# Patient Record
Sex: Male | Born: 1985 | Race: White | Hispanic: No | Marital: Single | Smoking: Never smoker
Health system: Southern US, Community
[De-identification: ages and names within clinical notes are randomized; demographics above are authoritative.]

## PROBLEM LIST (undated history)

## (undated) DIAGNOSIS — F840 Autistic disorder: Secondary | ICD-10-CM

## (undated) HISTORY — DX: Autistic disorder: F84.0

---
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
## 2016-05-01 DIAGNOSIS — F609 Personality disorder, unspecified: Secondary | ICD-10-CM | POA: Diagnosis not present

## 2016-05-14 DIAGNOSIS — F609 Personality disorder, unspecified: Secondary | ICD-10-CM | POA: Diagnosis not present

## 2016-05-22 DIAGNOSIS — F609 Personality disorder, unspecified: Secondary | ICD-10-CM | POA: Diagnosis not present

## 2016-05-29 DIAGNOSIS — F609 Personality disorder, unspecified: Secondary | ICD-10-CM | POA: Diagnosis not present

## 2016-05-31 DIAGNOSIS — K317 Polyp of stomach and duodenum: Secondary | ICD-10-CM | POA: Diagnosis not present

## 2016-05-31 DIAGNOSIS — K2950 Unspecified chronic gastritis without bleeding: Secondary | ICD-10-CM | POA: Diagnosis not present

## 2016-05-31 DIAGNOSIS — K2270 Barrett's esophagus without dysplasia: Secondary | ICD-10-CM | POA: Diagnosis not present

## 2016-06-04 DIAGNOSIS — F639 Impulse disorder, unspecified: Secondary | ICD-10-CM | POA: Diagnosis not present

## 2016-07-06 DIAGNOSIS — F840 Autistic disorder: Secondary | ICD-10-CM | POA: Diagnosis not present

## 2016-07-06 DIAGNOSIS — F429 Obsessive-compulsive disorder, unspecified: Secondary | ICD-10-CM | POA: Diagnosis not present

## 2016-07-06 DIAGNOSIS — F849 Pervasive developmental disorder, unspecified: Secondary | ICD-10-CM | POA: Diagnosis not present

## 2016-07-06 DIAGNOSIS — K22719 Barrett's esophagus with dysplasia, unspecified: Secondary | ICD-10-CM | POA: Diagnosis not present

## 2016-07-06 DIAGNOSIS — Z6829 Body mass index (BMI) 29.0-29.9, adult: Secondary | ICD-10-CM | POA: Diagnosis not present

## 2016-07-09 DIAGNOSIS — F913 Oppositional defiant disorder: Secondary | ICD-10-CM | POA: Diagnosis not present

## 2016-07-09 DIAGNOSIS — F840 Autistic disorder: Secondary | ICD-10-CM | POA: Diagnosis not present

## 2016-07-09 DIAGNOSIS — F4010 Social phobia, unspecified: Secondary | ICD-10-CM | POA: Diagnosis not present

## 2016-07-26 DIAGNOSIS — F4010 Social phobia, unspecified: Secondary | ICD-10-CM | POA: Diagnosis not present

## 2016-07-26 DIAGNOSIS — F913 Oppositional defiant disorder: Secondary | ICD-10-CM | POA: Diagnosis not present

## 2016-07-26 DIAGNOSIS — F840 Autistic disorder: Secondary | ICD-10-CM | POA: Diagnosis not present

## 2016-08-02 DIAGNOSIS — F913 Oppositional defiant disorder: Secondary | ICD-10-CM | POA: Diagnosis not present

## 2016-08-02 DIAGNOSIS — F4010 Social phobia, unspecified: Secondary | ICD-10-CM | POA: Diagnosis not present

## 2016-08-02 DIAGNOSIS — F840 Autistic disorder: Secondary | ICD-10-CM | POA: Diagnosis not present

## 2016-08-11 DIAGNOSIS — F4010 Social phobia, unspecified: Secondary | ICD-10-CM | POA: Diagnosis not present

## 2016-08-11 DIAGNOSIS — F840 Autistic disorder: Secondary | ICD-10-CM | POA: Diagnosis not present

## 2016-08-11 DIAGNOSIS — F913 Oppositional defiant disorder: Secondary | ICD-10-CM | POA: Diagnosis not present

## 2016-08-16 DIAGNOSIS — F840 Autistic disorder: Secondary | ICD-10-CM | POA: Diagnosis not present

## 2016-08-16 DIAGNOSIS — F4010 Social phobia, unspecified: Secondary | ICD-10-CM | POA: Diagnosis not present

## 2016-08-16 DIAGNOSIS — F913 Oppositional defiant disorder: Secondary | ICD-10-CM | POA: Diagnosis not present

## 2016-08-18 DIAGNOSIS — F840 Autistic disorder: Secondary | ICD-10-CM | POA: Diagnosis not present

## 2016-08-18 DIAGNOSIS — F4010 Social phobia, unspecified: Secondary | ICD-10-CM | POA: Diagnosis not present

## 2016-08-18 DIAGNOSIS — F913 Oppositional defiant disorder: Secondary | ICD-10-CM | POA: Diagnosis not present

## 2016-08-23 DIAGNOSIS — F4010 Social phobia, unspecified: Secondary | ICD-10-CM | POA: Diagnosis not present

## 2016-08-23 DIAGNOSIS — F840 Autistic disorder: Secondary | ICD-10-CM | POA: Diagnosis not present

## 2016-08-23 DIAGNOSIS — F913 Oppositional defiant disorder: Secondary | ICD-10-CM | POA: Diagnosis not present

## 2016-08-30 DIAGNOSIS — F913 Oppositional defiant disorder: Secondary | ICD-10-CM | POA: Diagnosis not present

## 2016-08-30 DIAGNOSIS — F4010 Social phobia, unspecified: Secondary | ICD-10-CM | POA: Diagnosis not present

## 2016-08-30 DIAGNOSIS — F840 Autistic disorder: Secondary | ICD-10-CM | POA: Diagnosis not present

## 2016-09-12 DIAGNOSIS — F4010 Social phobia, unspecified: Secondary | ICD-10-CM | POA: Diagnosis not present

## 2016-09-12 DIAGNOSIS — F913 Oppositional defiant disorder: Secondary | ICD-10-CM | POA: Diagnosis not present

## 2016-09-22 DIAGNOSIS — F4010 Social phobia, unspecified: Secondary | ICD-10-CM | POA: Diagnosis not present

## 2016-09-22 DIAGNOSIS — F913 Oppositional defiant disorder: Secondary | ICD-10-CM | POA: Diagnosis not present

## 2016-09-25 DIAGNOSIS — F840 Autistic disorder: Secondary | ICD-10-CM | POA: Diagnosis not present

## 2016-09-25 DIAGNOSIS — Z136 Encounter for screening for cardiovascular disorders: Secondary | ICD-10-CM | POA: Diagnosis not present

## 2016-09-25 DIAGNOSIS — K22719 Barrett's esophagus with dysplasia, unspecified: Secondary | ICD-10-CM | POA: Diagnosis not present

## 2016-10-01 DIAGNOSIS — F913 Oppositional defiant disorder: Secondary | ICD-10-CM | POA: Diagnosis not present

## 2016-10-01 DIAGNOSIS — F4010 Social phobia, unspecified: Secondary | ICD-10-CM | POA: Diagnosis not present

## 2016-10-02 DIAGNOSIS — Z0000 Encounter for general adult medical examination without abnormal findings: Secondary | ICD-10-CM | POA: Diagnosis not present

## 2016-10-02 DIAGNOSIS — F429 Obsessive-compulsive disorder, unspecified: Secondary | ICD-10-CM | POA: Diagnosis not present

## 2016-10-02 DIAGNOSIS — F840 Autistic disorder: Secondary | ICD-10-CM | POA: Diagnosis not present

## 2016-10-29 DIAGNOSIS — F913 Oppositional defiant disorder: Secondary | ICD-10-CM | POA: Diagnosis not present

## 2016-10-29 DIAGNOSIS — F4010 Social phobia, unspecified: Secondary | ICD-10-CM | POA: Diagnosis not present

## 2016-11-04 DIAGNOSIS — F913 Oppositional defiant disorder: Secondary | ICD-10-CM | POA: Diagnosis not present

## 2016-11-04 DIAGNOSIS — F4010 Social phobia, unspecified: Secondary | ICD-10-CM | POA: Diagnosis not present

## 2016-11-15 DIAGNOSIS — F913 Oppositional defiant disorder: Secondary | ICD-10-CM | POA: Diagnosis not present

## 2016-11-15 DIAGNOSIS — F4010 Social phobia, unspecified: Secondary | ICD-10-CM | POA: Diagnosis not present

## 2016-11-29 DIAGNOSIS — F913 Oppositional defiant disorder: Secondary | ICD-10-CM | POA: Diagnosis not present

## 2016-11-29 DIAGNOSIS — F4010 Social phobia, unspecified: Secondary | ICD-10-CM | POA: Diagnosis not present

## 2016-12-06 DIAGNOSIS — F4010 Social phobia, unspecified: Secondary | ICD-10-CM | POA: Diagnosis not present

## 2016-12-06 DIAGNOSIS — F913 Oppositional defiant disorder: Secondary | ICD-10-CM | POA: Diagnosis not present

## 2016-12-13 DIAGNOSIS — F913 Oppositional defiant disorder: Secondary | ICD-10-CM | POA: Diagnosis not present

## 2016-12-13 DIAGNOSIS — F4010 Social phobia, unspecified: Secondary | ICD-10-CM | POA: Diagnosis not present

## 2016-12-27 DIAGNOSIS — F913 Oppositional defiant disorder: Secondary | ICD-10-CM | POA: Diagnosis not present

## 2016-12-27 DIAGNOSIS — F4010 Social phobia, unspecified: Secondary | ICD-10-CM | POA: Diagnosis not present

## 2016-12-31 DIAGNOSIS — F4010 Social phobia, unspecified: Secondary | ICD-10-CM | POA: Diagnosis not present

## 2016-12-31 DIAGNOSIS — F913 Oppositional defiant disorder: Secondary | ICD-10-CM | POA: Diagnosis not present

## 2017-01-03 DIAGNOSIS — F913 Oppositional defiant disorder: Secondary | ICD-10-CM | POA: Diagnosis not present

## 2017-01-03 DIAGNOSIS — F4010 Social phobia, unspecified: Secondary | ICD-10-CM | POA: Diagnosis not present

## 2017-01-16 DIAGNOSIS — F4010 Social phobia, unspecified: Secondary | ICD-10-CM | POA: Diagnosis not present

## 2017-01-16 DIAGNOSIS — F913 Oppositional defiant disorder: Secondary | ICD-10-CM | POA: Diagnosis not present

## 2017-02-18 DIAGNOSIS — K22719 Barrett's esophagus with dysplasia, unspecified: Secondary | ICD-10-CM | POA: Diagnosis not present

## 2017-02-18 DIAGNOSIS — K210 Gastro-esophageal reflux disease with esophagitis: Secondary | ICD-10-CM | POA: Diagnosis not present

## 2017-02-28 DIAGNOSIS — Z23 Encounter for immunization: Secondary | ICD-10-CM | POA: Diagnosis not present

## 2017-03-13 DIAGNOSIS — F4010 Social phobia, unspecified: Secondary | ICD-10-CM | POA: Diagnosis not present

## 2017-03-13 DIAGNOSIS — F913 Oppositional defiant disorder: Secondary | ICD-10-CM | POA: Diagnosis not present

## 2017-03-20 DIAGNOSIS — F4010 Social phobia, unspecified: Secondary | ICD-10-CM | POA: Diagnosis not present

## 2017-03-20 DIAGNOSIS — F913 Oppositional defiant disorder: Secondary | ICD-10-CM | POA: Diagnosis not present

## 2017-03-21 DIAGNOSIS — F913 Oppositional defiant disorder: Secondary | ICD-10-CM | POA: Diagnosis not present

## 2017-03-21 DIAGNOSIS — F4010 Social phobia, unspecified: Secondary | ICD-10-CM | POA: Diagnosis not present

## 2017-03-27 DIAGNOSIS — F840 Autistic disorder: Secondary | ICD-10-CM | POA: Diagnosis not present

## 2017-03-27 DIAGNOSIS — K22719 Barrett's esophagus with dysplasia, unspecified: Secondary | ICD-10-CM | POA: Diagnosis not present

## 2017-03-27 DIAGNOSIS — Z1211 Encounter for screening for malignant neoplasm of colon: Secondary | ICD-10-CM | POA: Diagnosis not present

## 2017-04-03 DIAGNOSIS — F913 Oppositional defiant disorder: Secondary | ICD-10-CM | POA: Diagnosis not present

## 2017-04-03 DIAGNOSIS — F4010 Social phobia, unspecified: Secondary | ICD-10-CM | POA: Diagnosis not present

## 2017-04-10 DIAGNOSIS — F913 Oppositional defiant disorder: Secondary | ICD-10-CM | POA: Diagnosis not present

## 2017-04-10 DIAGNOSIS — F4010 Social phobia, unspecified: Secondary | ICD-10-CM | POA: Diagnosis not present

## 2017-05-07 DIAGNOSIS — F913 Oppositional defiant disorder: Secondary | ICD-10-CM | POA: Diagnosis not present

## 2017-05-07 DIAGNOSIS — F4010 Social phobia, unspecified: Secondary | ICD-10-CM | POA: Diagnosis not present

## 2017-05-29 DIAGNOSIS — F4010 Social phobia, unspecified: Secondary | ICD-10-CM | POA: Diagnosis not present

## 2017-05-29 DIAGNOSIS — F913 Oppositional defiant disorder: Secondary | ICD-10-CM | POA: Diagnosis not present

## 2017-06-06 DIAGNOSIS — F913 Oppositional defiant disorder: Secondary | ICD-10-CM | POA: Diagnosis not present

## 2017-06-06 DIAGNOSIS — F4010 Social phobia, unspecified: Secondary | ICD-10-CM | POA: Diagnosis not present

## 2017-06-13 DIAGNOSIS — F913 Oppositional defiant disorder: Secondary | ICD-10-CM | POA: Diagnosis not present

## 2017-06-13 DIAGNOSIS — F4010 Social phobia, unspecified: Secondary | ICD-10-CM | POA: Diagnosis not present

## 2017-06-19 DIAGNOSIS — F4010 Social phobia, unspecified: Secondary | ICD-10-CM | POA: Diagnosis not present

## 2017-06-19 DIAGNOSIS — F913 Oppositional defiant disorder: Secondary | ICD-10-CM | POA: Diagnosis not present

## 2017-06-26 DIAGNOSIS — F913 Oppositional defiant disorder: Secondary | ICD-10-CM | POA: Diagnosis not present

## 2017-06-26 DIAGNOSIS — F4010 Social phobia, unspecified: Secondary | ICD-10-CM | POA: Diagnosis not present

## 2017-07-02 DIAGNOSIS — F913 Oppositional defiant disorder: Secondary | ICD-10-CM | POA: Diagnosis not present

## 2017-07-02 DIAGNOSIS — F4010 Social phobia, unspecified: Secondary | ICD-10-CM | POA: Diagnosis not present

## 2017-07-03 DIAGNOSIS — F429 Obsessive-compulsive disorder, unspecified: Secondary | ICD-10-CM | POA: Diagnosis not present

## 2017-07-03 DIAGNOSIS — Z6829 Body mass index (BMI) 29.0-29.9, adult: Secondary | ICD-10-CM | POA: Diagnosis not present

## 2017-07-03 DIAGNOSIS — F840 Autistic disorder: Secondary | ICD-10-CM | POA: Diagnosis not present

## 2017-07-03 DIAGNOSIS — K22719 Barrett's esophagus with dysplasia, unspecified: Secondary | ICD-10-CM | POA: Diagnosis not present

## 2017-07-03 DIAGNOSIS — F849 Pervasive developmental disorder, unspecified: Secondary | ICD-10-CM | POA: Diagnosis not present

## 2017-07-10 DIAGNOSIS — F488 Other specified nonpsychotic mental disorders: Secondary | ICD-10-CM | POA: Diagnosis not present

## 2017-07-17 DIAGNOSIS — F488 Other specified nonpsychotic mental disorders: Secondary | ICD-10-CM | POA: Diagnosis not present

## 2017-07-22 DIAGNOSIS — F4010 Social phobia, unspecified: Secondary | ICD-10-CM | POA: Diagnosis not present

## 2017-07-22 DIAGNOSIS — F913 Oppositional defiant disorder: Secondary | ICD-10-CM | POA: Diagnosis not present

## 2017-07-24 DIAGNOSIS — F488 Other specified nonpsychotic mental disorders: Secondary | ICD-10-CM | POA: Diagnosis not present

## 2017-07-31 DIAGNOSIS — F488 Other specified nonpsychotic mental disorders: Secondary | ICD-10-CM | POA: Diagnosis not present

## 2017-08-14 DIAGNOSIS — F488 Other specified nonpsychotic mental disorders: Secondary | ICD-10-CM | POA: Diagnosis not present

## 2017-11-12 DIAGNOSIS — M545 Low back pain: Secondary | ICD-10-CM | POA: Diagnosis not present

## 2017-11-12 DIAGNOSIS — Z23 Encounter for immunization: Secondary | ICD-10-CM | POA: Diagnosis not present

## 2017-11-12 DIAGNOSIS — R748 Abnormal levels of other serum enzymes: Secondary | ICD-10-CM | POA: Diagnosis not present

## 2017-11-12 DIAGNOSIS — K2270 Barrett's esophagus without dysplasia: Secondary | ICD-10-CM | POA: Diagnosis not present

## 2017-11-15 ENCOUNTER — Other Ambulatory Visit: Payer: Self-pay | Admitting: Family Medicine

## 2017-11-15 DIAGNOSIS — R748 Abnormal levels of other serum enzymes: Secondary | ICD-10-CM

## 2017-11-20 ENCOUNTER — Other Ambulatory Visit: Payer: Self-pay

## 2017-11-22 ENCOUNTER — Ambulatory Visit
Admission: RE | Admit: 2017-11-22 | Discharge: 2017-11-22 | Disposition: A | Discharge status: Home / Self Care | Payer: Medicare Other | Source: Ambulatory Visit | Attending: Family Medicine | Admitting: Family Medicine

## 2017-11-22 DIAGNOSIS — R7989 Other specified abnormal findings of blood chemistry: Secondary | ICD-10-CM | POA: Diagnosis not present

## 2017-11-22 DIAGNOSIS — R748 Abnormal levels of other serum enzymes: Secondary | ICD-10-CM

## 2017-12-17 DIAGNOSIS — R159 Full incontinence of feces: Secondary | ICD-10-CM | POA: Diagnosis not present

## 2017-12-17 DIAGNOSIS — R197 Diarrhea, unspecified: Secondary | ICD-10-CM | POA: Diagnosis not present

## 2017-12-17 DIAGNOSIS — R748 Abnormal levels of other serum enzymes: Secondary | ICD-10-CM | POA: Diagnosis not present

## 2017-12-26 DIAGNOSIS — Z79891 Long term (current) use of opiate analgesic: Secondary | ICD-10-CM | POA: Diagnosis not present

## 2018-06-19 DIAGNOSIS — F902 Attention-deficit hyperactivity disorder, combined type: Secondary | ICD-10-CM | POA: Diagnosis not present

## 2018-06-19 DIAGNOSIS — F0634 Mood disorder due to known physiological condition with mixed features: Secondary | ICD-10-CM | POA: Diagnosis not present

## 2018-06-19 DIAGNOSIS — F840 Autistic disorder: Secondary | ICD-10-CM | POA: Diagnosis not present

## 2018-06-19 DIAGNOSIS — F419 Anxiety disorder, unspecified: Secondary | ICD-10-CM | POA: Diagnosis not present

## 2018-07-01 DIAGNOSIS — F609 Personality disorder, unspecified: Secondary | ICD-10-CM | POA: Diagnosis not present

## 2018-07-01 DIAGNOSIS — F840 Autistic disorder: Secondary | ICD-10-CM | POA: Diagnosis not present

## 2018-08-18 DIAGNOSIS — F639 Impulse disorder, unspecified: Secondary | ICD-10-CM | POA: Diagnosis not present

## 2018-08-18 DIAGNOSIS — F840 Autistic disorder: Secondary | ICD-10-CM | POA: Diagnosis not present

## 2018-08-18 DIAGNOSIS — F609 Personality disorder, unspecified: Secondary | ICD-10-CM | POA: Diagnosis not present

## 2018-08-18 DIAGNOSIS — R159 Full incontinence of feces: Secondary | ICD-10-CM | POA: Diagnosis not present

## 2018-09-10 DIAGNOSIS — F639 Impulse disorder, unspecified: Secondary | ICD-10-CM | POA: Diagnosis not present

## 2018-09-10 DIAGNOSIS — F609 Personality disorder, unspecified: Secondary | ICD-10-CM | POA: Diagnosis not present

## 2018-09-10 DIAGNOSIS — F840 Autistic disorder: Secondary | ICD-10-CM | POA: Diagnosis not present

## 2018-09-16 DIAGNOSIS — F918 Other conduct disorders: Secondary | ICD-10-CM | POA: Diagnosis not present

## 2018-09-16 DIAGNOSIS — F913 Oppositional defiant disorder: Secondary | ICD-10-CM | POA: Diagnosis not present

## 2018-09-16 DIAGNOSIS — F840 Autistic disorder: Secondary | ICD-10-CM | POA: Diagnosis not present

## 2018-09-18 DIAGNOSIS — F840 Autistic disorder: Secondary | ICD-10-CM | POA: Diagnosis not present

## 2018-09-25 DIAGNOSIS — F639 Impulse disorder, unspecified: Secondary | ICD-10-CM | POA: Diagnosis not present

## 2018-09-25 DIAGNOSIS — F609 Personality disorder, unspecified: Secondary | ICD-10-CM | POA: Diagnosis not present

## 2018-09-25 DIAGNOSIS — F913 Oppositional defiant disorder: Secondary | ICD-10-CM | POA: Diagnosis not present

## 2018-09-25 DIAGNOSIS — F840 Autistic disorder: Secondary | ICD-10-CM | POA: Diagnosis not present

## 2018-10-07 DIAGNOSIS — F913 Oppositional defiant disorder: Secondary | ICD-10-CM | POA: Diagnosis not present

## 2018-10-07 DIAGNOSIS — F918 Other conduct disorders: Secondary | ICD-10-CM | POA: Diagnosis not present

## 2018-10-07 DIAGNOSIS — F840 Autistic disorder: Secondary | ICD-10-CM | POA: Diagnosis not present

## 2018-10-09 DIAGNOSIS — F913 Oppositional defiant disorder: Secondary | ICD-10-CM | POA: Diagnosis not present

## 2018-10-09 DIAGNOSIS — F840 Autistic disorder: Secondary | ICD-10-CM | POA: Diagnosis not present

## 2018-12-20 IMAGING — US US ABDOMEN LIMITED
1 series · 14 of 25 positions shown · non-contrast
Comparison: None.

CLINICAL DATA: 31-year-old male with a history of elevated LFTs

EXAM:
ULTRASOUND ABDOMEN LIMITED RIGHT UPPER QUADRANT

[Series 1: us abdomen limited · 0.30mm/px · 14 of 39 slices shown]
[im 1/39]
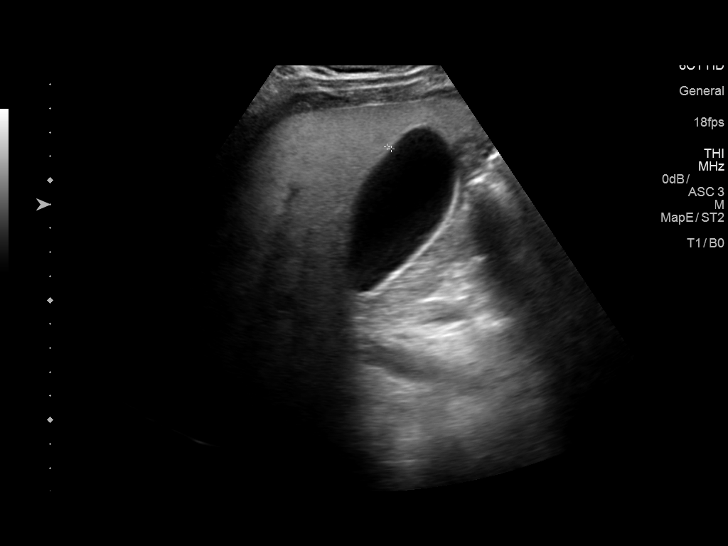
[im 4/39]
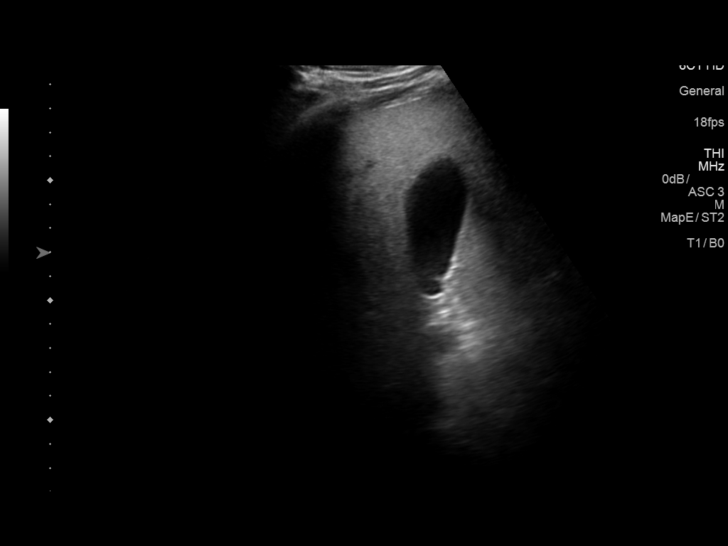
[im 7/39]
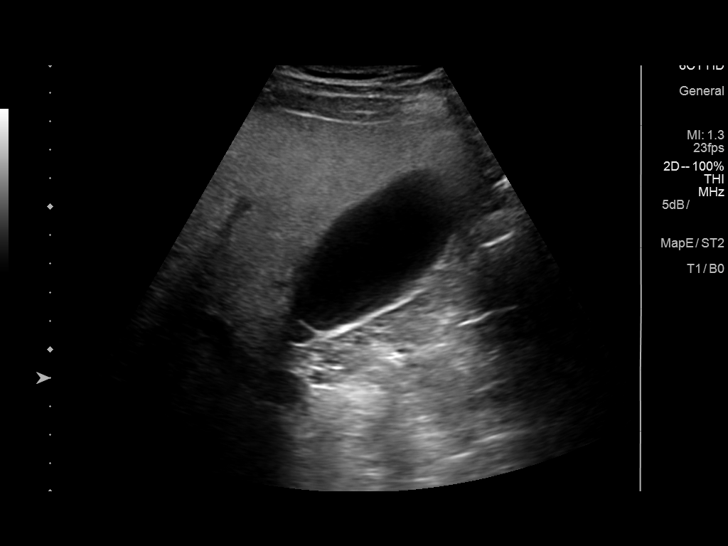
[im 10/39]
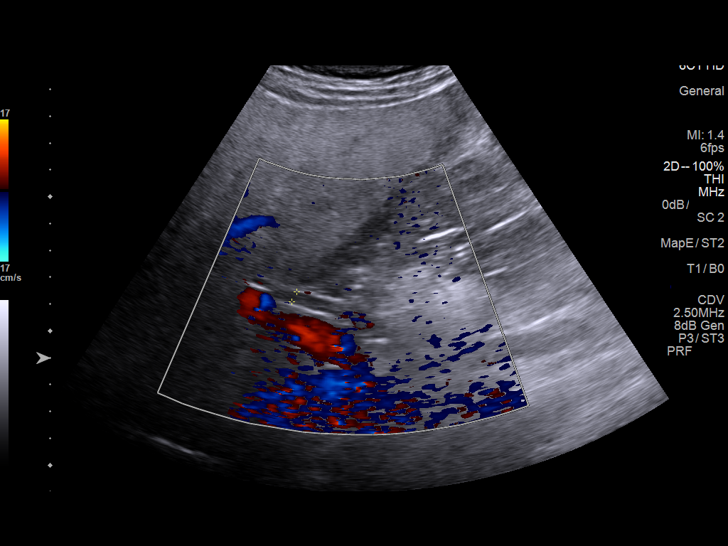
[im 13/39]
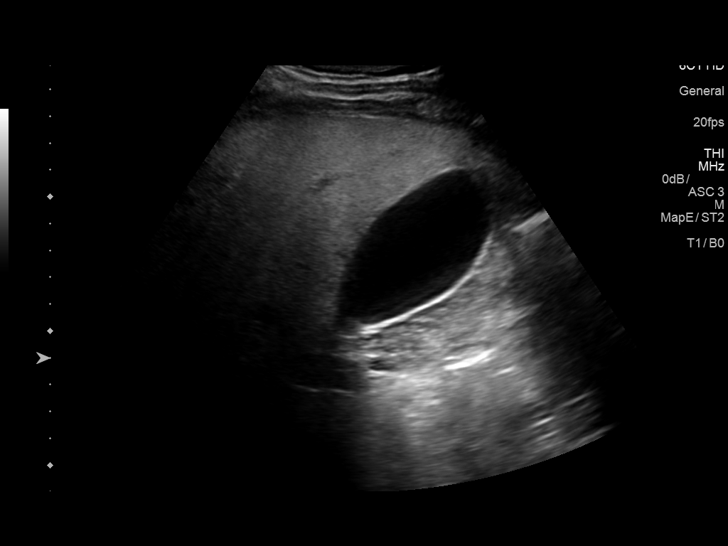
[im 15/39]
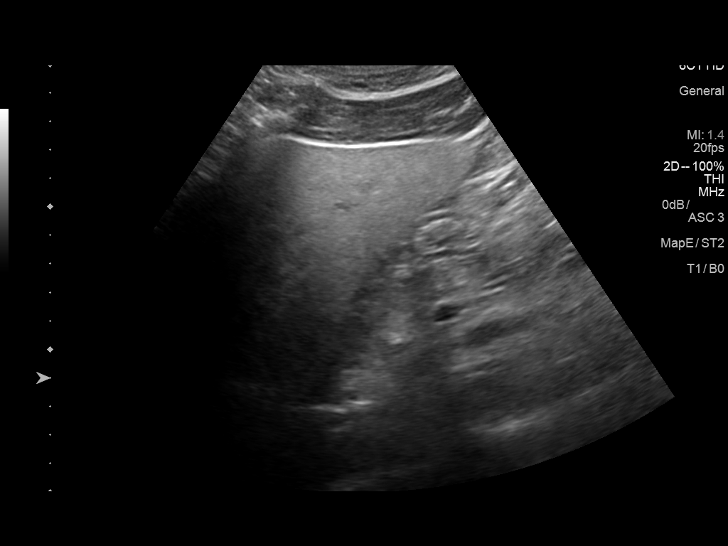
[im 18/39]
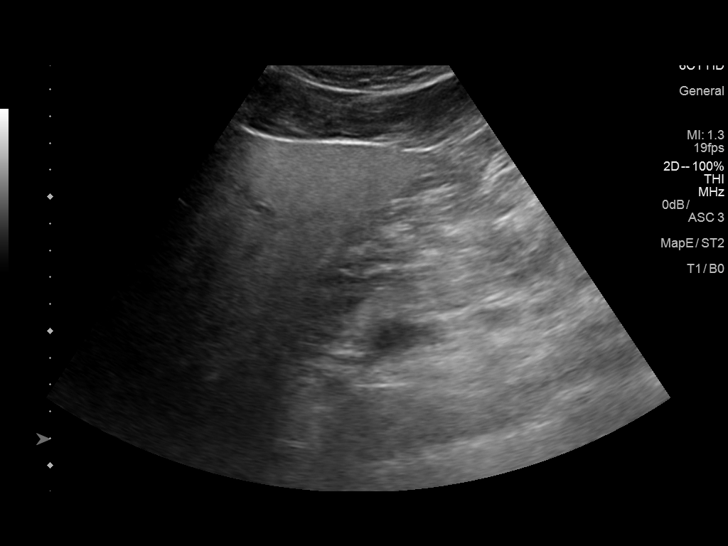
[im 21/39]
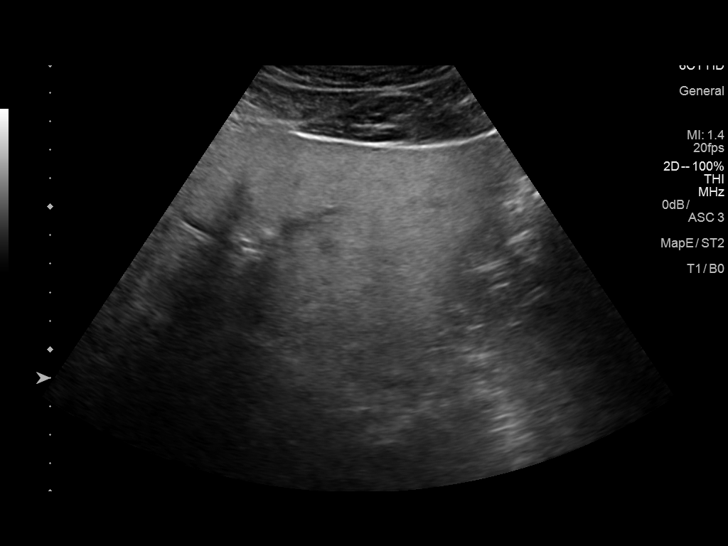
[im 24/39]
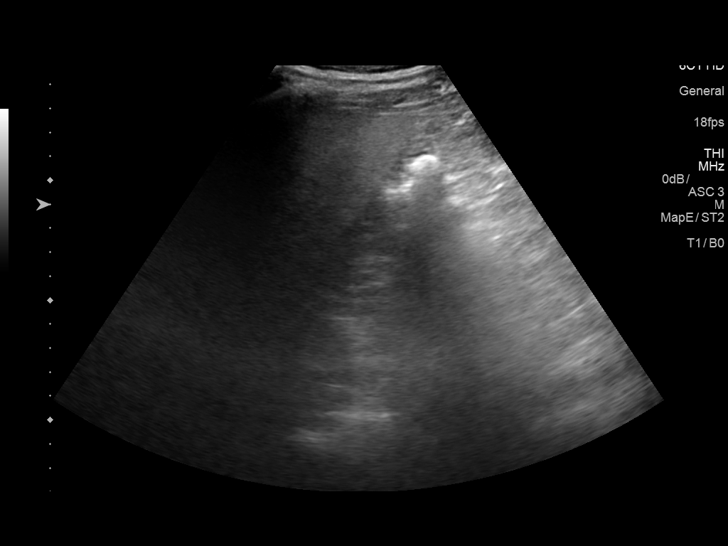
[im 26/39]
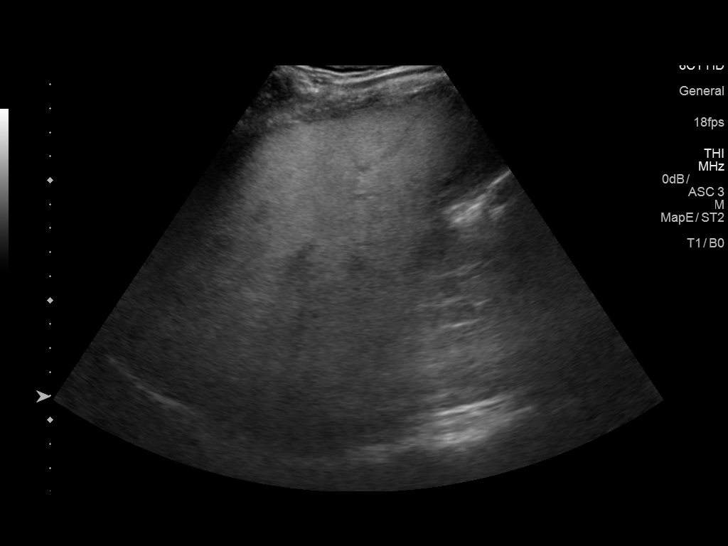
[im 29/39]
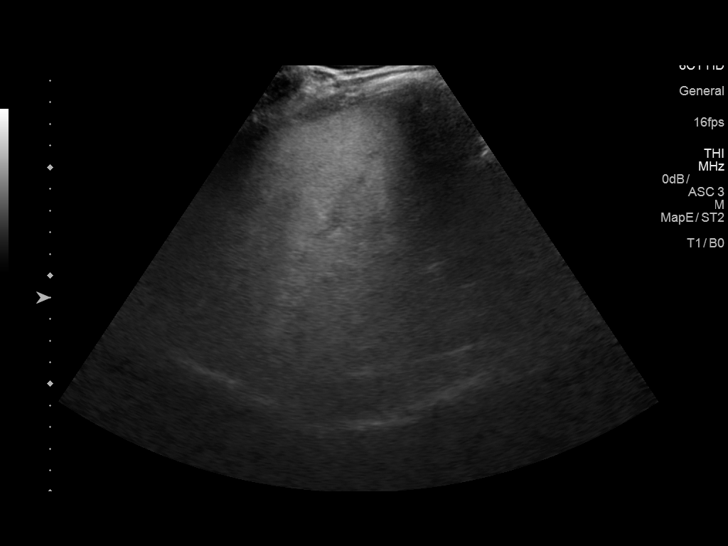
[im 32/39]
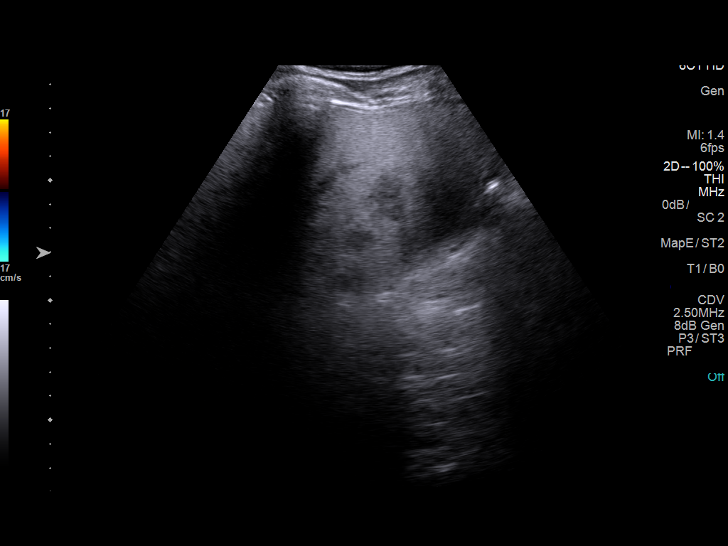
[im 35/39]
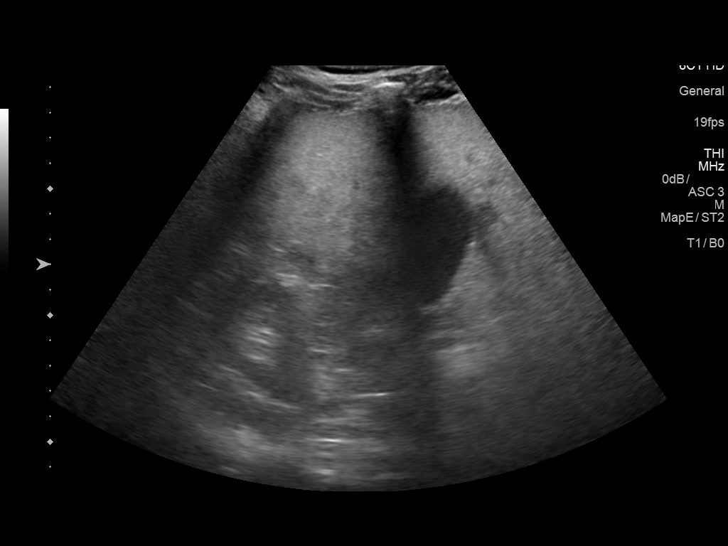
[im 39/39]
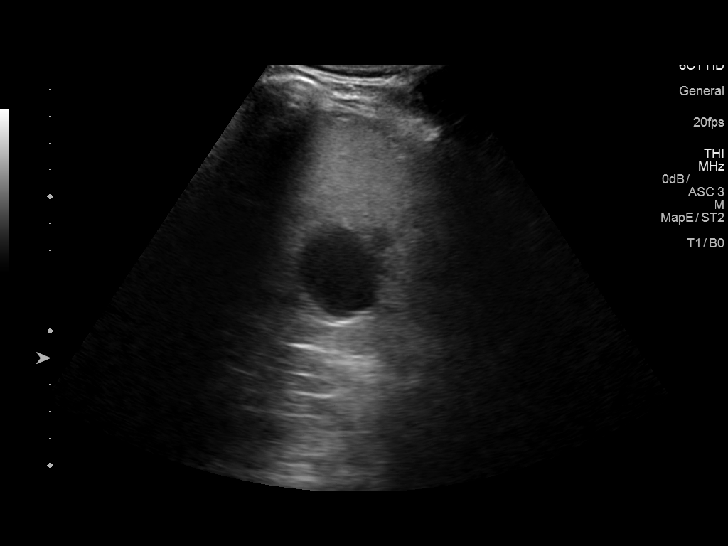

[14 of 25 positions shown; findings below may reference images not displayed]

FINDINGS: Gallbladder:

No gallstones or wall thickening visualized. No sonographic Murphy
sign noted by sonographer.

Common bile duct:

Diameter: 4 mm-5 mm

Liver:

Diffusely heterogeneous and increased echotexture of liver
parenchyma. Liver appears enlarged. Portal vein is patent on color
Doppler imaging with normal direction of blood flow towards the
liver.
IMPRESSION: Negative for cholelithiasis.

Heterogeneous appearance of liver suggesting nonspecific medical
liver disease.

## 2021-08-16 DIAGNOSIS — F418 Other specified anxiety disorders: Secondary | ICD-10-CM | POA: Insufficient documentation

## 2022-06-14 ENCOUNTER — Ambulatory Visit: Payer: Self-pay | Admitting: Psychology

## 2022-06-25 ENCOUNTER — Ambulatory Visit: Payer: Medicare Other | Admitting: Behavioral Health

## 2022-06-28 ENCOUNTER — Encounter: Payer: Self-pay | Admitting: Psychology

## 2022-06-28 NOTE — Patient Instructions (Signed)
Erroneous encounter

## 2022-06-28 NOTE — Progress Notes (Signed)
                Ivan Anchors, [facility] This encounter was created in error - please disregard.

## 2022-07-05 ENCOUNTER — Encounter: Payer: Self-pay | Admitting: Behavioral Health

## 2022-07-05 ENCOUNTER — Ambulatory Visit: Payer: Medicare Other | Admitting: Behavioral Health

## 2022-07-05 VITALS — BP 136/74 | HR 71 | Ht 68.0 in | Wt 184.0 lb

## 2022-07-05 DIAGNOSIS — F840 Autistic disorder: Secondary | ICD-10-CM

## 2022-07-05 DIAGNOSIS — F411 Generalized anxiety disorder: Secondary | ICD-10-CM

## 2022-07-05 DIAGNOSIS — F331 Major depressive disorder, recurrent, moderate: Secondary | ICD-10-CM
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
## 2022-07-26 ENCOUNTER — Encounter: Payer: Self-pay | Admitting: Behavioral Health

## 2022-07-26 ENCOUNTER — Ambulatory Visit (INDEPENDENT_AMBULATORY_CARE_PROVIDER_SITE_OTHER): Payer: Medicare Other | Admitting: Behavioral Health

## 2022-07-26 DIAGNOSIS — F331 Major depressive disorder, recurrent, moderate: Secondary | ICD-10-CM

## 2022-07-26 DIAGNOSIS — F840 Autistic disorder: Secondary | ICD-10-CM

## 2022-07-26 DIAGNOSIS — F411 Generalized anxiety disorder: Secondary | ICD-10-CM | POA: Diagnosis not present

## 2022-07-26 DIAGNOSIS — F70 Mild intellectual disabilities: Secondary | ICD-10-CM | POA: Diagnosis not present

## 2022-07-26 MED ORDER — HYDROXYZINE PAMOATE 25 MG PO CAPS: 25.0000 mg | ORAL_CAPSULE | Freq: Three times a day (TID) | ORAL | 3 refills | Status: DC

## 2022-07-26 MED ORDER — ARIPIPRAZOLE 30 MG PO TABS: 30.0000 mg | ORAL_TABLET | Freq: Every day | ORAL | 3 refills | Status: DC

## 2022-07-26 MED ORDER — FLUOXETINE HCL 40 MG PO CAPS: 80.0000 mg | ORAL_CAPSULE | Freq: Every day | ORAL | 3 refills | Status: DC

## 2022-07-26 MED ORDER — GUANFACINE HCL ER 2 MG PO TB24: 2.0000 mg | ORAL_TABLET | Freq: Every morning | ORAL | 3 refills | Status: DC
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
## 2022-07-30 DIAGNOSIS — R945 Abnormal results of liver function studies: Secondary | ICD-10-CM | POA: Diagnosis not present

## 2022-07-30 DIAGNOSIS — R748 Abnormal levels of other serum enzymes: Secondary | ICD-10-CM | POA: Diagnosis not present

## 2022-07-30 DIAGNOSIS — M79645 Pain in left finger(s): Secondary | ICD-10-CM | POA: Diagnosis not present

## 2022-07-30 DIAGNOSIS — K2270 Barrett's esophagus without dysplasia: Secondary | ICD-10-CM | POA: Diagnosis not present

## 2022-08-01 ENCOUNTER — Other Ambulatory Visit: Payer: Self-pay | Admitting: Family Medicine

## 2022-08-01 DIAGNOSIS — R748 Abnormal levels of other serum enzymes: Secondary | ICD-10-CM

## 2022-08-03 ENCOUNTER — Other Ambulatory Visit: Payer: Self-pay | Admitting: Behavioral Health

## 2022-08-03 DIAGNOSIS — F331 Major depressive disorder, recurrent, moderate: Secondary | ICD-10-CM

## 2022-08-03 DIAGNOSIS — F411 Generalized anxiety disorder: Secondary | ICD-10-CM

## 2022-08-03 DIAGNOSIS — F840 Autistic disorder: Secondary | ICD-10-CM

## 2022-08-07 ENCOUNTER — Telehealth: Payer: Self-pay | Admitting: Behavioral Health

## 2022-08-07 NOTE — Telephone Encounter (Signed)
Mom, Jocelyn, called at 2:25  She said you were going to call her with a recommendation on a Dr. for Caryn Bee to see that can treat him and prescribe medication.  She is still awaiting that call.  Please call her with you recommendation or referral.

## 2022-08-18 ENCOUNTER — Other Ambulatory Visit: Payer: Self-pay | Admitting: Behavioral Health

## 2022-08-18 DIAGNOSIS — F331 Major depressive disorder, recurrent, moderate: Secondary | ICD-10-CM

## 2022-08-18 DIAGNOSIS — F840 Autistic disorder: Secondary | ICD-10-CM

## 2022-08-18 DIAGNOSIS — F411 Generalized anxiety disorder: Secondary | ICD-10-CM

## 2022-08-18 DIAGNOSIS — F70 Mild intellectual disabilities: Secondary | ICD-10-CM

## 2022-08-23 DIAGNOSIS — K7689 Other specified diseases of liver: Secondary | ICD-10-CM | POA: Diagnosis not present

## 2022-08-23 DIAGNOSIS — R748 Abnormal levels of other serum enzymes: Secondary | ICD-10-CM | POA: Diagnosis not present

## 2022-08-24 ENCOUNTER — Other Ambulatory Visit: Payer: Self-pay | Admitting: Family Medicine

## 2022-08-24 DIAGNOSIS — K769 Liver disease, unspecified: Secondary | ICD-10-CM

## 2022-09-04 ENCOUNTER — Telehealth: Payer: Self-pay | Admitting: Behavioral Health

## 2022-09-04 NOTE — Telephone Encounter (Signed)
Mom, Elijah Gordon, called at 2:20 and indicated that she was still waiting for your call back about a referral to a provider in [place].  She also said that she had given a lot of records about [facility] care.  She would like it back.  Please advise if you have a referral for her and let me know if you still have the records so I can get them back to her.

## 2022-09-10 NOTE — Telephone Encounter (Signed)
Spoke with [place] today.  I place the information Arlys John found on Adult Autism in the packet of records.  Information provided is as follows:  [facility] [place] [phone]; can provide info on residential options; [facility] info for [place], [place] [phone]; [facility] [phone] for treatment of autism and Aspergers.  Jocelyn indicated she would pick up the packet today.

## 2022-09-24 ENCOUNTER — Encounter (HOSPITAL_COMMUNITY): Payer: Self-pay | Admitting: Registered Nurse

## 2022-09-24 ENCOUNTER — Ambulatory Visit (HOSPITAL_COMMUNITY)
Admission: EM | Admit: 2022-09-24 | Discharge: 2022-09-24 | Disposition: A | Discharge status: Home / Self Care | Payer: Medicaid Other | Attending: Registered Nurse | Admitting: Registered Nurse

## 2022-09-24 ENCOUNTER — Other Ambulatory Visit: Payer: Self-pay | Admitting: Behavioral Health

## 2022-09-24 DIAGNOSIS — F639 Impulse disorder, unspecified: Secondary | ICD-10-CM

## 2022-09-24 DIAGNOSIS — F429 Obsessive-compulsive disorder, unspecified: Secondary | ICD-10-CM

## 2022-09-24 DIAGNOSIS — F70 Mild intellectual disabilities: Secondary | ICD-10-CM

## 2022-09-24 DIAGNOSIS — Z638 Other specified problems related to primary support group: Secondary | ICD-10-CM

## 2022-09-24 DIAGNOSIS — F411 Generalized anxiety disorder: Secondary | ICD-10-CM

## 2022-09-24 DIAGNOSIS — F840 Autistic disorder: Secondary | ICD-10-CM

## 2022-09-24 DIAGNOSIS — F909 Attention-deficit hyperactivity disorder, unspecified type: Secondary | ICD-10-CM

## 2022-09-24 DIAGNOSIS — F331 Major depressive disorder, recurrent, moderate: Secondary | ICD-10-CM

## 2022-09-24 DIAGNOSIS — R4587 Impulsiveness: Secondary | ICD-10-CM

## 2022-09-24 MED ORDER — QUETIAPINE FUMARATE 25 MG PO TABS
ORAL_TABLET | ORAL | 2 refills | Status: DC
Start: 2022-09-24 — End: 2022-10-10

## 2022-09-24 MED ORDER — GUANFACINE HCL ER 3 MG PO TB24
3.0000 mg | ORAL_TABLET | Freq: Every day | ORAL | 2 refills | Status: DC
Start: 2022-09-24 — End: 2023-02-19

## 2022-09-24 MED ORDER — HYDROXYZINE PAMOATE 25 MG PO CAPS
25.0000 mg | ORAL_CAPSULE | Freq: Three times a day (TID) | ORAL | 3 refills | Status: DC
Start: 2022-09-24 — End: 2022-10-10

## 2022-09-24 MED ORDER — FLUOXETINE HCL 40 MG PO CAPS
80.0000 mg | ORAL_CAPSULE | Freq: Every day | ORAL | 3 refills | Status: DC
Start: 2022-09-24 — End: 2023-02-19

## 2022-09-24 MED ORDER — ARIPIPRAZOLE 30 MG PO TABS: 30.0000 mg | ORAL_TABLET | Freq: Every day | ORAL | 2 refills | Status: DC

## 2022-09-24 NOTE — Progress Notes (Signed)
   09/24/22 1155  [facility] Triage Screening (Walk-ins at [facility] only)  How Did You Hear About [place]? Family/Friend  What Is the Reason for Your Visit/Call Today? Patient presents voluntarily, accompanied by his mother and step-father for assessment.  Patient has been diagnosed with Autism Spectrum Disorder, GAD, IDD and MDD.  Patient just moved from [place], due to his father "just deserting him" per mother's report.  Patient was engaged in treatment in [place], however they have had difficulty getting him into treatment since he moved here in Jan 2024.  Patient states he needs a med change and states he has been "doing bad things and it's getting worse."  These behaviors, per mother, include patient getting into mother's bank account and etsy account an purchasing things and "messing up account settings."  He admits to "being good with computers" and his mother is concerned that if she limits access, he will "escalate."  She reports he has a hx of being aggressive and has destroyed items in the home during past episodes.  No recent incidents to report, outside of patient causing computer/internet issues recently.  Patient's mother was able to schedule an appt with [facility], however they have referred patient to other providers that treat patients with IDD and Autism.  He was then referred to [facility] and has completed the intake process.  He has an appt with psychiatrist, however mother.  How Long Has This Been Causing You Problems? 1-6 months  Have You Recently Had Any Thoughts About Hurting Yourself? No  Are You Planning to Commit Suicide/Harm Yourself At This time? No  Have you Recently Had Thoughts About Hurting Someone Karolee Ohs? No  Are You Planning To Harm Someone At This Time? No  Are you currently experiencing any auditory, visual or other hallucinations? No  Have You Used Any Alcohol or Drugs in the Past 24 Hours? No  Do you have any current medical co-morbidities that require immediate attention? No  Clinician  description of patient physical appearance/behavior: Patient has been calm, cooperative and pleasant since he arrived.  He is AAOx4.  What Do You Feel Would Help You the Most Today? Treatment for Depression or other mood problem  If access to [facility] was not available, would you have sought care in the Emergency Department? No  Determination of Need Routine (7 days)  Options For Referral Medication Management;Intensive Outpatient Therapy;Outpatient Therapy

## 2022-09-24 NOTE — ED Provider Notes (Cosign Needed Addendum)
Behavioral Health Urgent Care Medical Screening Exam  Patient Name: Elijah Gordon MRN: [id number] Date of Evaluation: 09/24/22 Chief Complaint:   Diagnosis:  Final diagnoses:  Obsessive-compulsive disorder, unspecified type  Attention deficit hyperactivity disorder (ADHD), unspecified ADHD type  Family discord  Impulse disorder  Autism spectrum disorder    History of Present illness: Elijah Gordon is a 37 y.o. male patient presented to [facility] as a walk in accompanied by his parents with complaints of worsening impulsive behaviors and need a medication change  Elijah Gordon, 37 y.o., male patient seen face to face by this provider, chart reviewed and consulted with Dr. Nelly Rout on 09/24/22.  On evaluation Elijah Gordon states "I need a medication change because my impulsiveness has gotten worse.   I do bad things and I admit that it has gotten worse.  Patient states that he doesn't currently have outpatient psychiatric services related to his provider letting go because didn't treat adult autism.  Patient gave permission for mother to sit in during assessment and give collateral information.  Mother states that patient was going to [facility] seeing Arlys John, NP for medication management but patient was let go because he didn't treat adult autism.   States they have had the intake at [facility] but it is 40 minutes away and wanting something closer to home.  States that it was just an intake and an appointment for medication management had to be made. States that the patient behaviors have worsen "he destroyed the internet, he is erasing TV shows that have been saved, he is getting into out bank accounts and it is just getting worse."  Mother states that just mentioned behaviors occurred with in the last 3 months but doesn't want him to get to that point and that his behaviors are getting worse and that if they take the computer out of the home then patient will destroy something in home.  Patient denies  suicidal/self-harm/homicidal ideation, psychosis, and paranoia   During evaluation Elijah Gordon is sitting in chair, dressed appropriate for weather with no noted distress.  He is alert/oriented x 4, calm, cooperative, attentive, and responses were relevant and appropriate to assessment questions.  He spoke in a clear tone at moderate volume, and normal pace, with good eye contact.   He denies suicidal/self-harm/homicidal ideation, psychosis, and paranoia.  Objectively:  there is no evidence of psychosis/mania or delusional thinking.  He conversed coherently, with goal directed thoughts, and no distractibility, or pre-occupation.  At this time Elijah Gordon and his mother are educated and verbalizes understanding of mental health resources and other crisis services in the community. He is instructed to call 911 and present to the nearest emergency room should he experience any suicidal/homicidal ideation, auditory/visual/hallucinations, or detrimental worsening of his mental health condition.  They are advised by writer that they could call the toll-free phone on back of Medicaid card to speak with care coordinator.  States that patient already has a Gaffer and is working with them.  Discussed option for medication management and resources given.  Referred back to [facility] until they have been seen by provider at [facility], walk in at [facility] or to call and inform that he needs to be seen soon for medication adjustment.     Flowsheet Row ED from 09/24/2022 in [facility]  C-SSRS RISK CATEGORY No Risk       Psychiatric Specialty Exam  Presentation  General Appearance:No data recorded Eye Contact:No data recorded Speech:No data  recorded Speech Volume:No data recorded Handedness:No data recorded  Mood and Affect  Mood:No data recorded Affect:No data recorded  Thought Process  Thought Processes:No data recorded Descriptions of Associations:No data  recorded Orientation:No data recorded Thought Content:No data recorded   Hallucinations:No data recorded Ideas of Reference:No data recorded Suicidal Thoughts:No data recorded Homicidal Thoughts:No data recorded  Sensorium  Memory:No data recorded Judgment:No data recorded Insight:No data recorded  Executive Functions  Concentration:No data recorded Attention Span:No data recorded Recall:No data recorded Fund of Knowledge:No data recorded Language:No data recorded  Psychomotor Activity  Psychomotor Activity:No data recorded  Assets  Assets:No data recorded  Sleep  Sleep:No data recorded Number of hours: No data recorded  Physical Exam: Physical Exam Vitals and nursing note reviewed. Exam conducted with a chaperone present (Mother present).  Constitutional:      General: He is not in acute distress.    Appearance: Normal appearance. He is not ill-appearing.  HENT:     Head: Normocephalic.  Eyes:     Pupils: Pupils are equal, round, and reactive to light.  Cardiovascular:     Rate and Rhythm: Normal rate.  Pulmonary:     Effort: Pulmonary effort is normal.  Musculoskeletal:        General: Normal range of motion.     Cervical back: Normal range of motion.  Skin:    General: Skin is warm and dry.  Neurological:     Mental Status: He is alert and oriented to person, place, and time.  Psychiatric:        Attention and Perception: Attention and perception normal. He does not perceive auditory or visual hallucinations.        Mood and Affect: Mood and affect normal.        Speech: Speech normal.        Behavior: Behavior normal. Behavior is cooperative.        Thought Content: Thought content normal. Thought content is not paranoid or delusional. Thought content does not include homicidal or suicidal ideation.        Judgment: Judgment is impulsive.    Review of Systems  Constitutional:        No complaints voiced   Psychiatric/Behavioral:  Depression: Stable.  Hallucinations: denies. Substance abuse: Denies. Suicidal ideas: Denies. Nervous/anxious: Stable.        States he needs a medication change  All other systems reviewed and are negative.  Blood pressure (!) 156/91, pulse 77, temperature 98.4 F (36.9 C), temperature source Oral, resp. rate 18, SpO2 99 %. There is no height or weight on file to calculate BMI.  Musculoskeletal: Strength & Muscle Tone: within normal limits Gait & Station: normal Patient leans: N/A   [facility] MSE Discharge Disposition for Follow up and Recommendations: Based on my evaluation the patient does not appear to have an emergency medical condition and can be discharged with resources and follow up care in outpatient services for Medication Management and Individual Therapy   Spoke to Avelina Laine, NP at [facility] and states that he is going to assess current medications and see what adjustment can be made.   States that he is adding Seroquel 25 mg Bid as needed and will increase Guanfacine to 3 mg.       Analeese Andreatta, NP 09/24/2022, 4:43 PM

## 2022-09-24 NOTE — Progress Notes (Signed)
Elijah Gordon has not found another provider yet or obtained an appointment with resources provided. I was consulted by [facility] today due to his visit there. I agreed to [facility]  his medications for two months but he needs to reach out to resources that treat Adult Autism with severe behavioral problems.

## 2022-09-24 NOTE — Discharge Instructions (Addendum)
Spoke to [employer], NP at [facility] and states that he is calling in prescription for an increase in Guanfacine to 3 mg and Adding Seroquel 25 mg twice a day as needed.  States will need to have new provider to call in refills or make more changes.     Based on the information that you have provided and the presenting issues outpatient services and resources for have been recommended.  It is imperative that you follow through with treatment recommendations within 5-7 days from the of discharge to mitigate further risk to your safety and mental well-being. A list of referrals has been provided below to get you started.  You are not limited to the list provided.  In case of an urgent crisis, you may contact the Mobile Crisis Unit with [facility] at [phone].       CopierBusiness.co.uk  Local Management Entity?Managed Care Organizations (LME/MCOs) [facility] Currently has 6 LME/MCOs operating under the [facility] b/c [facility] [address] [place], [place], [address] [phone] phone [phone] fax Crisis Line: [phone] [place] served: [place], [place], [place], [place], [place], [place]   [place] Office [address] [place], [place], [address] [phone] phone [phone] fax Crisis Line: [phone] Counties served: [place], [place], [place], [place], [place], [place], [place], [facility] [address] [place], [place], [address] [phone] phone [phone] fax Crisis Line: [phone] [place] served: Cathie Beams, [place], [place], [place], [place], Rosetta Posner, [facility] [address] [place], [place], [address] [phone] phone [phone] fax Crisis Line: [phone] [facility] served: Thomasenia Sales, [place],  [place], [place], [place], [place], Garlan Fair, [place]  [employer] [phone] [address] [place], [phone] phone Crisis Line: [phone] [place] served: [place], [place], [place], [place], [place], [place], [place], [place], [place], [place], [place], [place], [place], [place], [place], [place], [place], [place], [place], [place], [place], [place], [place], [place], [place], [place], [place], [facility] [address] [place], [place], [address] [phone] phone [phone] fax Crisis Line: [phone] [place] served: [place], [place], [place], [place], [place], [place], [place], [place], [place], [place], [place], [place], [place], [place], [place], [place], [place], [place], [place], [place], [place], [place], [place], [place], [place], [place], [place], [place], [place], Sherlon Handing  [email or web address]                            Autism Services/Providers  The following are clinicians within [facility] who are supposed to be specialized in working with individuals who have autism, according the [facility] Provider Directory- [email or web address]/[email or web address].  Massie Maroon Gagne [address] [place], [place], [address] [phone] phone  Andree Coss [address] [place], [place], [address] [phone] phone  Yvonne Kendall [id number] [place], [place], [address] [phone] phone  Marguerita Beards [address] [place], [place], [address] [phone] phone  Vickie Lennette Bihari [address] [place], [place], [address] [phone] phone  Marisa Cyphers Cantrell [address] [place], [place], [address] [phone] phone  [facility], [place] [address], [place], [address] [phone] phone  It is  extremely important for caregivers to link with support groups to lessen the feeling of being isolated with this and for validation. Below is a support group for caregivers and family who have loved ones with ASD. The Autism Society of [place]  can also provide additional resources that would be helpful. This organization does have a camp for children and adolescents with ASD to meet, socialize, and engage in activities together. Also check out where they may be able to also help with placement as well as assessments and therapy.  The [facility] for [facility] and Autism Services  [address], [place], [address] [phone] phone Includes: Early Intervention, General Treatment for ASD, Classroom Readiness, Social Skills Groups, and Group Family Training  Autism Society of [facility] for [facility]richment (ACLE) [address] [place], [place], [address] [phone] phone  The [facility] of [place] [address] [place], [place], [address] [phone] phone  [facility] [address] [place], [address] [place], [place], [address] [phone] phone  Autism Society of [facility] Find a Chapter/Support Group Chapters and Support Groups provide a place for families who face similar challenges to feel understood as they offer each other encouragement. guil[place]chapter@autismsociety -RefurbishedBikes.be  www.bgaither.com.[place] For more information about events and meetups, please see the calendar, contact the Chapter by email, or join the Facebook group. https://www.autismsociety-[email or web address]

## 2022-09-25 DIAGNOSIS — H5213 Myopia, bilateral: Secondary | ICD-10-CM | POA: Diagnosis not present

## 2022-09-25 DIAGNOSIS — H10413 Chronic giant papillary conjunctivitis, bilateral: Secondary | ICD-10-CM | POA: Diagnosis not present

## 2022-09-26 ENCOUNTER — Ambulatory Visit
Admission: RE | Admit: 2022-09-26 | Discharge: 2022-09-26 | Disposition: A | Discharge status: Home / Self Care | Payer: Medicare Other | Source: Ambulatory Visit | Attending: Family Medicine | Admitting: Family Medicine

## 2022-09-26 DIAGNOSIS — K7689 Other specified diseases of liver: Secondary | ICD-10-CM | POA: Diagnosis not present

## 2022-09-26 DIAGNOSIS — K769 Liver disease, unspecified: Secondary | ICD-10-CM

## 2022-09-26 DIAGNOSIS — K760 Fatty (change of) liver, not elsewhere classified: Secondary | ICD-10-CM | POA: Diagnosis not present

## 2022-09-26 MED ORDER — GADOPICLENOL 0.5 MMOL/ML IV SOLN
8.0000 mL | Freq: Once | INTRAVENOUS | Status: AC | PRN
  Administered 2022-09-26: 8 mL via INTRAVENOUS

## 2022-10-02 DIAGNOSIS — R21 Rash and other nonspecific skin eruption: Secondary | ICD-10-CM | POA: Diagnosis not present

## 2022-10-10 ENCOUNTER — Ambulatory Visit (INDEPENDENT_AMBULATORY_CARE_PROVIDER_SITE_OTHER): Payer: Medicare Other | Admitting: Allergy

## 2022-10-10 ENCOUNTER — Other Ambulatory Visit: Payer: Self-pay

## 2022-10-10 ENCOUNTER — Encounter: Payer: Self-pay | Admitting: Allergy

## 2022-10-10 VITALS — BP 114/60 | HR 72 | Temp 98.6°F | Resp 18 | Ht 67.0 in | Wt 184.6 lb

## 2022-10-10 DIAGNOSIS — L308 Other specified dermatitis: Secondary | ICD-10-CM

## 2022-10-10 MED ORDER — FAMOTIDINE 20 MG PO TABS
20.0000 mg | ORAL_TABLET | Freq: Every day | ORAL | 3 refills | Status: DC
Start: 2022-10-10 — End: 2023-01-07

## 2022-10-10 NOTE — Progress Notes (Signed)
New Patient Note  RE: Elijah Gordon MRN: [id number] DOB: Feb 06, 1986 Date of Office Visit: 10/10/2022  Primary care provider: Irven Coe  Chief Complaint: rash, itching  History of present illness: Elijah Gordon is a 37 y.o. male presenting today for evaluation of itchy rash.  He presents today with his mother.    He has been having a itchy rash now for the past 3-4 weeks.  The rash is mostly on his arms but he has had it on his sides of the trunk.  He can feel the rash as little tiny bumps.  They do not seem to morphed into larger bumps and it seems to be persistent at this time.  His sister is an OBGYN who recommended he take benadryl.  This does not really help. He went to a walk-in clinic who prescribed prednisone taper that didn't change the rash at all.  He feels like the rash looks redder now than before.  He has been taking antihistamine, Zyrtec which she does daily for his seasonal allergies.  He has tried Careers adviser as well in the past.  The benadryl maybe stopped the itch for a bit but didn't help the rash resolve.  Also tried hydroxyzine that he has for anxiety but it makes him sleepy thus he doesn't like to take this.   He lived in [place] by himself for a while and there he had bugs in the apartment and did have an exterminator come and feels like he periodically had a itchy rash then as well.  This was sometime last year.  He is now living at his parents home.  No one else in the household has a similar rash or symptoms.  He is not having any fevers, night sweats or chills.  No swelling.  No joint aches or pains.    Review of systems: Review of Systems  Constitutional: Negative.   HENT: Negative.    Eyes: Negative.   Respiratory: Negative.    Cardiovascular: Negative.   Musculoskeletal: Negative.   Skin:  Positive for rash.  Allergic/Immunologic: Negative.   Neurological: Negative.     All other systems negative unless noted above in HPI  Past medical history: Past Medical  History:  Diagnosis Date   Autism     Past surgical history: History reviewed. No pertinent surgical history.  Family history:  Family History  Problem Relation Age of Onset   Eczema Mother    Allergic rhinitis Mother    ADD / ADHD Mother    Allergic rhinitis Sister     Social history: Lives in a home with carpeting in the bedroom with electric heating and central cooling.  Dogs in the home.  No concern for roaches in the home.  Unsure about water damage or mildew.  At this time does not report occupation.  He denies a smoking history.   Medication List: Current Outpatient Medications  Medication Sig Dispense Refill   ARIPiprazole (ABILIFY) 30 MG tablet Take 1 tablet (30 mg total) by mouth daily. 90 tablet 2   cetirizine (ZYRTEC) 10 MG chewable tablet Chew 10 mg by mouth daily.     cyanocobalamin (VITAMIN B12) 1000 MCG tablet Take 2,000-2,500 mcg by mouth daily.     famotidine (PEPCID) 20 MG tablet Take 1 tablet (20 mg total) by mouth daily. 30 tablet 3   FLUoxetine (PROZAC) 40 MG capsule Take 2 capsules (80 mg total) by mouth daily. 30 capsule 3   GuanFACINE HCl 3 MG TB24 Take 1 tablet (  3 mg total) by mouth daily after breakfast. 30 tablet 2   Multiple Vitamins-Minerals (MENS MULTIVITAMIN PLUS PO) Take 1 tablet by mouth daily.     omeprazole (PRILOSEC) 40 MG capsule Take 40 mg by mouth daily.     No current facility-administered medications for this visit.    Known medication allergies: Allergies  Allergen Reactions   Sulfa Antibiotics Anaphylaxis     Physical examination: Blood pressure 114/60, pulse 72, temperature 98.6 F (37 C), temperature source Temporal, resp. rate 18, height 5\' 7"  (1.702 m), weight 184 lb 9.6 oz (83.7 kg), SpO2 96 %.  General: Alert, interactive, in no acute distress. HEENT: PERRLA, TMs pearly gray, turbinates non-edematous without discharge, post-pharynx non erythematous. Neck: Supple without lymphadenopathy. Lungs: Clear to auscultation  without wheezing, rhonchi or rales. {no increased work of breathing. CV: Normal S1, S2 without murmurs. Abdomen: Nondistended, nontender. Skin: Fine erythematous papules on anterior forearm and  . Extremities:  No clubbing, cyanosis or edema. Neuro:   Grossly intact.  Diagnositics/Labs: Has had recent antihistamines and decision made to defer skin testing for serum IgE testing   Assessment and plan: Pruritic dermatitis - for chronic itchy rash will obtain following labs to see if anything identifiable as trigger: environmental allergy panel, chicken and wheat IgE, tryptase level, cbc and cmp - recommend starting antihistamine regimen with Allegra 180mg  1 tab with Pepcid 20mg  1 tab daily.  If needed to extra control can take both twice a day.  Allegra will replace Zyrtec for now.   Allegra is a H1 blocker and Pepcid is a H2 blocker to both histamine more completely.   You can take Pepcid and Omeprazole as they work differently for reflux control.   - after bathing/showering moisturize skin to help lock in hydration.  Dry skin is itchy skin.   Follow-up in 2-3 months or sooner if needed  I appreciate the opportunity to take part in Islam's care. Please do not hesitate to contact me with questions.  Sincerely,   Margo Aye, MD Allergy/Immunology [facility]

## 2022-10-10 NOTE — Patient Instructions (Addendum)
Itchy rash - for chronic itchy rash will obtain following labs to see if anything identifiable as trigger: environmental allergy panel, chicken and wheat IgE, tryptase level, cbc and cmp - recommend starting antihistamine regimen with Allegra 180mg  1 tab with Pepcid 20mg  1 tab daily.  If needed to extra control can take both twice a day.  Allegra will replace Zyrtec for now.   Allegra is a H1 blocker and Pepcid is a H2 blocker to both histamine more completely.   You can take Pepcid and Omeprazole as they work differently for reflux control.   - after bathing/showering moisturize skin to help lock in hydration.  Dry skin is itchy skin.   Follow-up in 2-3 months or sooner if needed

## 2022-10-11 LAB — COMPREHENSIVE METABOLIC PANEL
AST: 30 IU/L (ref 0–40)
BUN/Creatinine Ratio: 17 (ref 9–20)
Bilirubin Total: 0.3 mg/dL (ref 0.0–1.2)
Calcium: 9.9 mg/dL (ref 8.7–10.2)

## 2022-10-11 LAB — CBC WITH DIFFERENTIAL
Eos: 1 %
Immature Granulocytes: 1 %
Lymphs: 36 %
Neutrophils Absolute: 4.3 10*3/uL (ref 1.4–7.0)
WBC: 7.8 10*3/uL (ref 3.4–10.8)

## 2022-10-11 LAB — CHRONIC URTICARIA

## 2022-10-11 LAB — ALLERGEN, WHEAT, F4

## 2022-10-11 LAB — ALLERGENS W/TOTAL IGE AREA 2

## 2022-10-12 LAB — COMPREHENSIVE METABOLIC PANEL
Alkaline Phosphatase: 90 IU/L (ref 44–121)
CO2: 21 mmol/L (ref 20–29)

## 2022-10-12 LAB — ALLERGENS W/TOTAL IGE AREA 2
Alternaria Alternata IgE: <0.1 kU/L
Bermuda Grass IgE: 1.39 kU/L — AB
Cat Dander IgE: <0.1 kU/L
D Farinae IgE: <0.1 kU/L
Maple/Box Elder IgE: 0.46 kU/L — AB
Timothy Grass IgE: 4.99 kU/L — AB

## 2022-10-12 LAB — TRYPTASE: Tryptase: 5.8 ug/L (ref 2.2–13.2)

## 2022-10-12 LAB — CBC WITH DIFFERENTIAL: Immature Grans (Abs): 0 10*3/uL (ref 0.0–0.1)

## 2022-10-16 LAB — ALLERGENS W/TOTAL IGE AREA 2
Aspergillus Fumigatus IgE: <0.1 kU/L
Cockroach, German IgE: <0.1 kU/L
Common Silver Birch IgE: 0.34 kU/L — AB
D Pteronyssinus IgE: <0.1 kU/L
Dog Dander IgE: <0.1 kU/L
IgE (Immunoglobulin E), Serum: 16 IU/mL (ref 6–495)
Johnson Grass IgE: 1.22 kU/L — AB
Mouse Urine IgE: <0.1 kU/L
Oak, White IgE: <0.1 kU/L
Pecan, Hickory IgE: 0.75 kU/L — AB
Penicillium Chrysogen IgE: <0.1 kU/L
Ragweed, Short IgE: 0.32 kU/L — AB
Sheep Sorrel IgE Qn: <0.1 kU/L
White Mulberry IgE: <0.1 kU/L

## 2022-10-16 LAB — CBC WITH DIFFERENTIAL
Basophils Absolute: 0.1 10*3/uL (ref 0.0–0.2)
Basos: 1 %
EOS (ABSOLUTE): 0.1 10*3/uL (ref 0.0–0.4)
Hematocrit: 43 % (ref 37.5–51.0)
Hemoglobin: 14.9 g/dL (ref 13.0–17.7)
Lymphocytes Absolute: 2.8 10*3/uL (ref 0.7–3.1)
MCH: 29.8 pg (ref 26.6–33.0)
MCHC: 34.7 g/dL (ref 31.5–35.7)
MCV: 86 fL (ref 79–97)
Monocytes Absolute: 0.5 10*3/uL (ref 0.1–0.9)
Monocytes: 6 %
Neutrophils: 55 %
RBC: 5 x10E6/uL (ref 4.14–5.80)
RDW: 13.3 % (ref 11.6–15.4)

## 2022-10-16 LAB — COMPREHENSIVE METABOLIC PANEL
ALT: 66 IU/L — ABNORMAL HIGH (ref 0–44)
Albumin: 4.7 g/dL (ref 4.1–5.1)
BUN: 15 mg/dL (ref 6–20)
Chloride: 104 mmol/L (ref 96–106)
Creatinine, Ser: 0.86 mg/dL (ref 0.76–1.27)
Globulin, Total: 2.2 g/dL (ref 1.5–4.5)
Glucose: 101 mg/dL — ABNORMAL HIGH (ref 70–99)
Potassium: 4 mmol/L (ref 3.5–5.2)
Sodium: 141 mmol/L (ref 134–144)
Total Protein: 6.9 g/dL (ref 6.0–8.5)
eGFR: 114 mL/min/{1.73_m2} (ref >59)

## 2022-10-16 LAB — ALLERGEN, CHICKEN F83: Chicken IgE: <0.1 kU/L

## 2022-10-24 ENCOUNTER — Encounter: Payer: Self-pay | Admitting: Allergy

## 2022-11-04 ENCOUNTER — Other Ambulatory Visit: Payer: Self-pay

## 2022-11-04 ENCOUNTER — Encounter (HOSPITAL_BASED_OUTPATIENT_CLINIC_OR_DEPARTMENT_OTHER): Payer: Self-pay

## 2022-11-04 ENCOUNTER — Emergency Department (HOSPITAL_BASED_OUTPATIENT_CLINIC_OR_DEPARTMENT_OTHER)
Admission: EM | Admit: 2022-11-04 | Discharge: 2022-11-04 | Disposition: A | Discharge status: Home / Self Care | Payer: 59 | Attending: Emergency Medicine | Admitting: Emergency Medicine

## 2022-11-04 DIAGNOSIS — R112 Nausea with vomiting, unspecified: Secondary | ICD-10-CM | POA: Insufficient documentation

## 2022-11-04 DIAGNOSIS — F840 Autistic disorder: Secondary | ICD-10-CM | POA: Insufficient documentation

## 2022-11-04 LAB — COMPREHENSIVE METABOLIC PANEL
ALT: 102 U/L — ABNORMAL HIGH (ref 0–44)
AST: <5 U/L — ABNORMAL LOW (ref 15–41)
Albumin: 5.3 g/dL — ABNORMAL HIGH (ref 3.5–5.0)
Alkaline Phosphatase: 76 U/L (ref 38–126)
Anion gap: 10 (ref 5–15)
BUN: 16 mg/dL (ref 6–20)
CO2: 27 mmol/L (ref 22–32)
Calcium: 10 mg/dL (ref 8.9–10.3)
Chloride: 103 mmol/L (ref 98–111)
Creatinine, Ser: 0.99 mg/dL (ref 0.61–1.24)
GFR, Estimated: >60 mL/min (ref >60)
Glucose, Bld: 93 mg/dL (ref 70–99)
Potassium: 3.6 mmol/L (ref 3.5–5.1)
Sodium: 140 mmol/L (ref 135–145)
Total Bilirubin: 0.4 mg/dL (ref 0.3–1.2)
Total Protein: 7.5 g/dL (ref 6.5–8.1)

## 2022-11-04 LAB — CBC
HCT: 47.2 % (ref 39.0–52.0)
Hemoglobin: 16.2 g/dL (ref 13.0–17.0)
MCH: 29.2 pg (ref 26.0–34.0)
MCHC: 34.3 g/dL (ref 30.0–36.0)
MCV: 85 fL (ref 80.0–100.0)
Platelets: 377 10*3/uL (ref 150–400)
RBC: 5.55 MIL/uL (ref 4.22–5.81)
RDW: 12.3 % (ref 11.5–15.5)
WBC: 8.5 10*3/uL (ref 4.0–10.5)
nRBC: 0 % (ref 0.0–0.2)

## 2022-11-04 LAB — URINALYSIS, ROUTINE W REFLEX MICROSCOPIC
Bilirubin Urine: NEGATIVE
Glucose, UA: NEGATIVE mg/dL
Hgb urine dipstick: NEGATIVE
Ketones, ur: NEGATIVE mg/dL
Leukocytes,Ua: NEGATIVE
Nitrite: NEGATIVE
Protein, ur: NEGATIVE mg/dL
Specific Gravity, Urine: 1.015 (ref 1.005–1.030)
pH: 6.5 (ref 5.0–8.0)

## 2022-11-04 MED ORDER — ONDANSETRON 4 MG PO TBDP: 4.0000 mg | ORAL_TABLET | Freq: Three times a day (TID) | ORAL | 0 refills | Status: DC | PRN

## 2022-11-04 MED ORDER — ONDANSETRON 4 MG PO TBDP
8.0000 mg | ORAL_TABLET | Freq: Once | ORAL | Status: AC
  Administered 2022-11-04: 8 mg via ORAL
  Filled 2022-11-04: qty 2

## 2022-11-04 NOTE — ED Notes (Signed)
Pt spoke to this RN and PA. Sts he does not feel better but does not want anymore medications or interventions and wants to go home. Given d/c papers with family bedside.

## 2022-11-04 NOTE — Discharge Instructions (Addendum)
It was a pleasure taking care of you here in the emergency department  Make sure you follow-up with your primary care provider  Return for new or worsening symptoms

## 2022-11-04 NOTE — ED Triage Notes (Signed)
Patient here POV from Home.  Endorses not taking usual behavioral medications including Prozac and Abilify without 1 Week. For 1 Week has been having Chills, Shaking. More recently has been having nausea and vomiting. No Pain.   No SI/HI/AVH.   NAD Noted during Triage. A&Ox4. GCS 15. Ambulatory.

## 2022-11-04 NOTE — ED Provider Notes (Signed)
[place] EMERGENCY DEPARTMENT AT [facility] Provider Note   CSN: [id number] Arrival date & time: 11/04/22  2037    History  Chief Complaint  Patient presents with   Emesis    Elijah Gordon is a 37 y.o. male history of autism here for evaluation of feeling unwell and possible medication withdrawal.  He states that he went off of his Abilify, Prozac approximately 1 week ago.  Since then he has had some generalized weakness, chills, nausea and vomiting.  He denies any pain.  No SI, HI, AVH.  He states he simply "did not want to take the medicines anymore."  Mother does state that his cognition has improved since being off the medications however he feels generally unwell.  Has low appetite, sleeping schedule has become off.  Recent injury or trauma.  No headache, chest pain, shortness of breath.  No syncope.  PCP apparently did not know the head, face medications however mother did states she ran this by the [facility] doctor who said this was okay.  They did not taper off the medications.  Family states he has known elevated liver enzymes which she is being seen by GI tomorrow for evaluation for.  Followed by allergy for pruritus.  He has no pain when he eats or drinks anything however just has overall decreased appetite.  Last bowel movement 3 days ago, typically goes every other day.  At his baseline mentation per family in room.  Intermittently have myalgias.  HPI     Home Medications Prior to Admission medications   Medication Sig Start Date End Date Taking? Authorizing Provider  ondansetron (ZOFRAN-ODT) 4 MG disintegrating tablet Take 1 tablet (4 mg total) by mouth every 8 (eight) hours as needed for nausea or vomiting. 11/04/22  Yes Zosia Lucchese A, PA-C  ARIPiprazole (ABILIFY) 30 MG tablet Take 1 tablet (30 mg total) by mouth daily. 09/24/22   Joan Flores, NP  cetirizine (ZYRTEC) 10 MG chewable tablet Chew 10 mg by mouth daily.    [provider]  cyanocobalamin  (VITAMIN B12) 1000 MCG tablet Take 2,000-2,500 mcg by mouth daily.    [provider]  famotidine (PEPCID) 20 MG tablet Take 1 tablet (20 mg total) by mouth daily. 10/10/22   Marcelyn Bruins, MD  FLUoxetine (PROZAC) 40 MG capsule Take 2 capsules (80 mg total) by mouth daily. 09/24/22   Joan Flores, NP  GuanFACINE HCl 3 MG TB24 Take 1 tablet (3 mg total) by mouth daily after breakfast. 09/24/22   Joan Flores, NP  Multiple Vitamins-Minerals (MENS MULTIVITAMIN PLUS PO) Take 1 tablet by mouth daily.    [provider]  omeprazole (PRILOSEC) 40 MG capsule Take 40 mg by mouth daily.    [provider]      Allergies    Sulfa antibiotics    Review of Systems   Review of Systems  Constitutional:  Positive for activity change, appetite change and fatigue.  HENT: Negative.    Respiratory: Negative.    Cardiovascular: Negative.   Gastrointestinal:  Positive for nausea and vomiting. Negative for abdominal distention, abdominal pain, anal bleeding, blood in stool, constipation, diarrhea and rectal pain.  Genitourinary: Negative.   Musculoskeletal:  Positive for myalgias.  Skin: Negative.   Neurological: Negative.   All other systems reviewed and are negative.   Physical Exam Updated Vital Signs BP (!) 146/97 (BP Location: Right Arm)   Pulse 89   Temp 99.1 F (37.3 C) (Oral)  Resp 18   Ht 5\' 7"  (1.702 m)   Wt 80.7 kg   SpO2 98%   BMI 27.88 kg/m  Physical Exam Vitals and nursing note reviewed.  Constitutional:      General: He is not in acute distress.    Appearance: He is well-developed. He is not ill-appearing, toxic-appearing or diaphoretic.  HENT:     Head: Normocephalic and atraumatic.     Nose: Nose normal.     Mouth/Throat:     Mouth: Mucous membranes are moist.  Eyes:     Pupils: Pupils are equal, round, and reactive to light.  Cardiovascular:     Rate and Rhythm: Normal rate and regular rhythm.     Pulses: Normal pulses.     Heart  sounds: Normal heart sounds.  Pulmonary:     Effort: Pulmonary effort is normal. No respiratory distress.  Abdominal:     General: Bowel sounds are normal. There is no distension.     Palpations: Abdomen is soft.     Tenderness: There is no abdominal tenderness. There is no right CVA tenderness, left CVA tenderness, guarding or rebound.  Musculoskeletal:        General: No swelling, tenderness, deformity or signs of injury. Normal range of motion.     Cervical back: Normal range of motion and neck supple.     Right lower leg: No edema.     Left lower leg: No edema.  Skin:    General: Skin is warm and dry.     Capillary Refill: Capillary refill takes less than 2 seconds.  Neurological:     General: No focal deficit present.     Mental Status: He is alert and oriented to person, place, and time.     Cranial Nerves: No cranial nerve deficit.     Sensory: No sensory deficit.     Motor: No weakness.     Gait: Gait normal.    ED Results / Procedures / Treatments   Labs (all labs ordered are listed, but only abnormal results are displayed) Labs Reviewed  COMPREHENSIVE METABOLIC PANEL - Abnormal; Notable for the following components:      Result Value   Albumin 5.3 (*)    AST <5 (*)    ALT 102 (*)    All other components within normal limits  CBC  URINALYSIS, ROUTINE W REFLEX MICROSCOPIC    EKG None  Radiology No results found.  Procedures Procedures    Medications Ordered in ED Medications  ondansetron (ZOFRAN-ODT) disintegrating tablet 8 mg (8 mg Oral Given 11/04/22 2221)    ED Course/ Medical Decision Making/ A&P   37 year old here for evaluation of feeling unwell after going off of his Prozac and Abilify 1 week ago without tapering.  He is having difficulty sleeping, having chills, nausea, vomiting.  He denies any pain.  No headaches, syncope, falls or injuries.  He has noted chronic LFT elevation according to mother and they are seeing GI tomorrow for this.  No pain  with eating or drinking.  Appears otherwise well.  Heart and lungs clear, abdomen soft, nontender.  Labs personally viewed and interpreted:  CBC without leukocytosis  metabolic panel ALT 102 UA negative for infection  I offered further workup with patient and family.  Patient states he "just wants to go home.  I did discuss possibly restarting his medications to see if this would help and then possibly tapering off or switching to different medications however family and patient are adamant  that they did not want to restart the medications or add additional medications to his regimens.  Was able to give him Zofran here.  Tolerates p.o. intake.  I discussed symptomatic management.  Given offered further workup however patient declines and states he "just wants to go home."  Family states they will take him home since they "cannot force him to do anything."  I encouraged him to take the Zofran as needed, let his PCP know that he went off of these medications and return for new or worsening symptoms or if they change their mind about further workup.  Symptoms could possibly due to withdrawal from his medications however again they do not want to add any additional medications to his regimens.                            Medical Decision Making Amount and/or Complexity of Data Reviewed Independent Historian: parent External Data Reviewed: labs and notes. Labs: ordered. Decision-making details documented in ED Course.  Risk OTC drugs. Prescription drug management. Diagnosis or treatment significantly limited by social determinants of health.          Final Clinical Impression(s) / ED Diagnoses Final diagnoses:  Nausea and vomiting, unspecified vomiting type    Rx / DC Orders ED Discharge Orders          Ordered    ondansetron (ZOFRAN-ODT) 4 MG disintegrating tablet  Every 8 hours PRN        11/04/22 2236              Griffith Santilli A, PA-C 11/04/22 2322    Tegeler,  Canary Brim, MD 11/04/22 2325

## 2022-11-05 DIAGNOSIS — K219 Gastro-esophageal reflux disease without esophagitis: Secondary | ICD-10-CM | POA: Diagnosis not present

## 2022-11-05 DIAGNOSIS — R932 Abnormal findings on diagnostic imaging of liver and biliary tract: Secondary | ICD-10-CM | POA: Diagnosis not present

## 2022-11-06 DIAGNOSIS — L299 Pruritus, unspecified: Secondary | ICD-10-CM | POA: Diagnosis not present

## 2022-11-06 DIAGNOSIS — R6889 Other general symptoms and signs: Secondary | ICD-10-CM | POA: Diagnosis not present

## 2022-12-12 DIAGNOSIS — K2970 Gastritis, unspecified, without bleeding: Secondary | ICD-10-CM | POA: Diagnosis not present

## 2022-12-12 DIAGNOSIS — K2270 Barrett's esophagus without dysplasia: Secondary | ICD-10-CM | POA: Diagnosis not present

## 2022-12-12 DIAGNOSIS — K219 Gastro-esophageal reflux disease without esophagitis: Secondary | ICD-10-CM | POA: Diagnosis not present

## 2022-12-13 DIAGNOSIS — R053 Chronic cough: Secondary | ICD-10-CM | POA: Diagnosis not present

## 2022-12-14 ENCOUNTER — Other Ambulatory Visit: Payer: Self-pay | Admitting: Family Medicine

## 2022-12-14 DIAGNOSIS — K2970 Gastritis, unspecified, without bleeding: Secondary | ICD-10-CM | POA: Diagnosis not present

## 2022-12-14 DIAGNOSIS — R053 Chronic cough: Secondary | ICD-10-CM

## 2022-12-14 DIAGNOSIS — K2270 Barrett's esophagus without dysplasia: Secondary | ICD-10-CM | POA: Diagnosis not present

## 2022-12-17 ENCOUNTER — Ambulatory Visit
Admission: RE | Admit: 2022-12-17 | Discharge: 2022-12-17 | Disposition: A | Discharge status: Home / Self Care | Payer: 59 | Source: Ambulatory Visit | Attending: Family Medicine | Admitting: Family Medicine

## 2022-12-17 DIAGNOSIS — R053 Chronic cough: Secondary | ICD-10-CM | POA: Diagnosis not present

## 2022-12-17 DIAGNOSIS — J45909 Unspecified asthma, uncomplicated: Secondary | ICD-10-CM | POA: Diagnosis not present

## 2022-12-17 DIAGNOSIS — R059 Cough, unspecified: Secondary | ICD-10-CM | POA: Diagnosis not present

## 2022-12-17 MED ORDER — IOPAMIDOL (ISOVUE-300) INJECTION 61%
75.0000 mL | Freq: Once | INTRAVENOUS | Status: AC | PRN
  Administered 2022-12-17: 75 mL via INTRAVENOUS

## 2023-01-02 ENCOUNTER — Encounter: Payer: Self-pay | Admitting: Student in an Organized Health Care Education/Training Program

## 2023-01-02 ENCOUNTER — Ambulatory Visit: Payer: 59 | Admitting: Student in an Organized Health Care Education/Training Program

## 2023-01-02 VITALS — BP 126/80 | HR 78 | Temp 97.6°F | Ht 67.0 in | Wt 178.4 lb

## 2023-01-02 DIAGNOSIS — R053 Chronic cough: Secondary | ICD-10-CM | POA: Diagnosis not present

## 2023-01-03 ENCOUNTER — Other Ambulatory Visit: Payer: Self-pay | Admitting: Family Medicine

## 2023-01-03 DIAGNOSIS — R748 Abnormal levels of other serum enzymes: Secondary | ICD-10-CM

## 2023-01-03 NOTE — Progress Notes (Signed)
Synopsis: Referred in chronic cough by Clovis Riley, L.August Saucer, MD  Assessment & Plan:   1. Chronic cough  He is presenting for the evaluation of chronic cough that has been ongoing for many months per patient and his mother and many years on review of the medical record.  It appears that he was evaluated by gastroenterology at [facility] (unable to review the EGD report) and initiated on PPI without improvement in symptoms.  He was also seen by an allergist with management of allergic rhinitis not helping with his cough.  Given presentation, I am suspecting reflux associated cough (given worsening after meals) as well as possible obstructive airway disease.  I will initiate a workup with a double contrast esophagogram as well as a pulmonary function test.  I have counseled the patient and his mother about the importance of vitamin eating foods that exacerbate reflux disease from his diet.  Specifically, I asked him to decrease his coffee, carbonated beverage, and Gatorade intake.  - DG ESOPHAGUS W DOUBLE CM (HD); Future - Pulmonary function test; Future   Return in about 4 weeks (around 01/30/2023).  I spent 60 minutes caring for this patient today, including preparing to see the patient, obtaining a medical history , reviewing a separately obtained history, performing a medically appropriate examination and/or evaluation, counseling and educating the patient/family/caregiver, ordering medications, tests, or procedures, documenting clinical information in the electronic health record, and independently interpreting results (not separately reported/billed) and communicating results to the patient/family/caregiver  Raechel Chute, MD [facility] 01/03/2023 1:28 PM    End of visit medications:  No orders of the defined types were placed in this encounter.    Current Outpatient Medications:    albuterol (VENTOLIN HFA) 108 (90 Base) MCG/ACT inhaler, Inhale 1 puff into the lungs  every 4 (four) hours as needed for wheezing or shortness of breath., Disp: , Rfl:    ARIPiprazole (ABILIFY) 30 MG tablet, Take 1 tablet (30 mg total) by mouth daily., Disp: 90 tablet, Rfl: 2   cetirizine (ZYRTEC) 10 MG chewable tablet, Chew 10 mg by mouth daily., Disp: , Rfl:    cyanocobalamin (VITAMIN B12) 1000 MCG tablet, Take 2,000-2,500 mcg by mouth daily., Disp: , Rfl:    famotidine (PEPCID) 20 MG tablet, Take 1 tablet (20 mg total) by mouth daily., Disp: 30 tablet, Rfl: 3   FLUoxetine (PROZAC) 40 MG capsule, Take 2 capsules (80 mg total) by mouth daily., Disp: 30 capsule, Rfl: 3   GuanFACINE HCl 3 MG TB24, Take 1 tablet (3 mg total) by mouth daily after breakfast., Disp: 30 tablet, Rfl: 2   Multiple Vitamins-Minerals (MENS MULTIVITAMIN PLUS PO), Take 1 tablet by mouth daily., Disp: , Rfl:    omeprazole (PRILOSEC) 40 MG capsule, Take 40 mg by mouth daily., Disp: , Rfl:    ondansetron (ZOFRAN-ODT) 4 MG disintegrating tablet, Take 1 tablet (4 mg total) by mouth every 8 (eight) hours as needed for nausea or vomiting., Disp: 20 tablet, Rfl: 0   Subjective:   PATIENT ID: Elijah Gordon GENDER: male DOB: Jul 03, 1985, MRN: [id number]  Chief Complaint  Patient presents with   pulmonary consult    Patient reports cough is worse late in the day. Is using albuterol inhaler every 4 hours.     HPI  The patient is a pleasant 37 year old male presenting to clinic for the chief complaint of chronic cough. Patient has autistic spectrum disorder and is accompanied by his mother who helped with history taking.  Patient and mother are reporting a chronic cough that is dry and non-productive for many months now. They report that it can happen at any point, and is not exacerbated nor alleviated by any factors. He has no chest pain, chest tightness, or chest discomfort. He has no shortness of breath at rest or with exertion. He has not experienced any recurrent pulmonary infections. The cough does not wake him up  from sleep. On further questioning, they report an increase in the cough after meals. He denies any aspiration of food contents, but rather that the cough becomes more frequent and more prevalent. Patient enjoys drinking carbonated beverages, sodas, gatorade, and juice. He also enjoys coffee and tomato based sauces.  Patient and his mother reported he was evaluated at [facility] gastroenterology where he was told he does not have reflux though he was started on a PPI.  Patient was also seen by an allergist who has prescribed him a second-generation antihistamine for management of allergic rhinitis.  Neither interventions have improved the patient's cough.    On review of his past medical record neither in care everywhere, I noted multiple visits at the [facility] clinic with multiple providers including gastroenterology and pulmonology for chronic cough dating back to 2015.  Patient at that point was living with his father who is helping care for him and he was given nebulizer and an inhaler.  Notes report improvement with these interventions however patient and his mother report that he did not get any nebulizers.  Patient is a non-smoker and denies any inhalational exposure.  He is disabled secondary to his autistic spectrum disorder.  He has not had any occupational exposures.  Ancillary information including prior medications, full medical/surgical/family/social histories, and PFTs (when available) are listed below and have been reviewed.   Review of Systems  Constitutional:  Negative for chills, fever and weight loss.  Respiratory:  Positive for cough. Negative for hemoptysis, sputum production, shortness of breath and wheezing.   Cardiovascular:  Negative for chest pain, palpitations, orthopnea and claudication.     Objective:   Vitals:   01/02/23 1421  BP: 126/80  Pulse: 78  Temp: 97.6 F (36.4 C)  TempSrc: Temporal  SpO2: 98%  Weight: 178 lb 6.4 oz (80.9 kg)  Height: 5\' 7"  (1.702 m)    98% on RA BMI Readings from Last 3 Encounters:  01/02/23 27.94 kg/m  11/04/22 27.88 kg/m  10/10/22 28.91 kg/m   Wt Readings from Last 3 Encounters:  01/02/23 178 lb 6.4 oz (80.9 kg)  11/04/22 178 lb (80.7 kg)  10/10/22 184 lb 9.6 oz (83.7 kg)    Physical Exam Constitutional:      Appearance: Normal appearance.  Cardiovascular:     Rate and Rhythm: Normal rate and regular rhythm.     Pulses: Normal pulses.     Heart sounds: Normal heart sounds. No murmur heard. Pulmonary:     Effort: Pulmonary effort is normal. No respiratory distress.     Breath sounds: Normal breath sounds. No wheezing or rales.  Abdominal:     Palpations: Abdomen is soft.  Neurological:     General: No focal deficit present.     Mental Status: He is alert and oriented to person, place, and time. Mental status is at baseline.       Ancillary Information    Past Medical History:  Diagnosis Date   Autism      Family History  Problem Relation Age of Onset   Eczema Mother  Allergic rhinitis Mother    ADD / ADHD Mother    Allergic rhinitis Sister      No past surgical history on file.  Social History   Socioeconomic History   Marital status: Single    Spouse name: Not on file   Number of children: Not on file   Years of education: Not on file   Highest education level: Not on file  Occupational History   Occupation: [employer]  Tobacco Use   Smoking status: Never    Passive exposure: Never   Smokeless tobacco: Never  Vaping Use   Vaping status: Never Used  Substance and Sexual Activity   Alcohol use: Not Currently   Drug use: Never   Sexual activity: Never  Other Topics Concern   Not on file  Social History Narrative   Lives at home with mother and step father in [place].    Social Determinants of Health   Financial Resource Strain: Not on file  Food Insecurity: Not on file  Transportation Needs: Not on file  Physical Activity: Not on file  Stress: Not  on file  Social Connections: Unknown (04/26/2021)   Received from [facility]   Social Connection and Isolation Panel [NHANES]    Frequency of Communication with Friends and Family: Not on file    Frequency of Social Gatherings with Friends and Family: Not on file    Attends Religious Services: Not on file    Active Member of Clubs or Organizations: Not on file    Attends Club or Organization Meetings: Not on file    Marital Status: Never married  Intimate Partner Violence: Not on file     Allergies  Allergen Reactions   Sulfa Antibiotics Anaphylaxis     CBC    Component Value Date/Time   WBC 8.5 11/04/2022 2052   RBC 5.55 11/04/2022 2052   HGB 16.2 11/04/2022 2052   HGB 14.9 10/10/2022 1600   HCT 47.2 11/04/2022 2052   HCT 43.0 10/10/2022 1600   PLT 377 11/04/2022 2052   MCV 85.0 11/04/2022 2052   MCV 86 10/10/2022 1600   MCH 29.2 11/04/2022 2052   MCHC 34.3 11/04/2022 2052   RDW 12.3 11/04/2022 2052   RDW 13.3 10/10/2022 1600   LYMPHSABS 2.8 10/10/2022 1600   EOSABS 0.1 10/10/2022 1600   BASOSABS 0.1 10/10/2022 1600    Pulmonary Functions Testing Results:     No data to display          Outpatient Medications Prior to Visit  Medication Sig Dispense Refill   albuterol (VENTOLIN HFA) 108 (90 Base) MCG/ACT inhaler Inhale 1 puff into the lungs every 4 (four) hours as needed for wheezing or shortness of breath.     ARIPiprazole (ABILIFY) 30 MG tablet Take 1 tablet (30 mg total) by mouth daily. 90 tablet 2   cetirizine (ZYRTEC) 10 MG chewable tablet Chew 10 mg by mouth daily.     cyanocobalamin (VITAMIN B12) 1000 MCG tablet Take 2,000-2,500 mcg by mouth daily.     famotidine (PEPCID) 20 MG tablet Take 1 tablet (20 mg total) by mouth daily. 30 tablet 3   FLUoxetine (PROZAC) 40 MG capsule Take 2 capsules (80 mg total) by mouth daily. 30 capsule 3   GuanFACINE HCl 3 MG TB24 Take 1 tablet (3 mg total) by mouth daily after breakfast. 30 tablet 2   Multiple  Vitamins-Minerals (MENS MULTIVITAMIN PLUS PO) Take 1 tablet by mouth daily.     omeprazole (  PRILOSEC) 40 MG capsule Take 40 mg by mouth daily.     ondansetron (ZOFRAN-ODT) 4 MG disintegrating tablet Take 1 tablet (4 mg total) by mouth every 8 (eight) hours as needed for nausea or vomiting. 20 tablet 0   No facility-administered medications prior to visit.

## 2023-01-04 ENCOUNTER — Telehealth: Payer: Self-pay | Admitting: Student in an Organized Health Care Education/Training Program

## 2023-01-04 ENCOUNTER — Ambulatory Visit (HOSPITAL_COMMUNITY)
Admission: RE | Admit: 2023-01-04 | Discharge: 2023-01-04 | Disposition: A | Discharge status: Home / Self Care | Payer: 59 | Source: Ambulatory Visit | Attending: Student in an Organized Health Care Education/Training Program | Admitting: Student in an Organized Health Care Education/Training Program

## 2023-01-04 DIAGNOSIS — R053 Chronic cough: Secondary | ICD-10-CM | POA: Diagnosis not present

## 2023-01-04 NOTE — Telephone Encounter (Signed)
Pt. Called had DG ES SCOUT CH DEL IMG 2X CM done at [facility] today and want to know from Dr.Dgayli if he still needs to have the pft on 9.18 please advise pt.

## 2023-01-04 NOTE — Telephone Encounter (Signed)
Called and spoke to patient.  He is questioning if PFT is still needed since swallow study showed reflux. He now feels that sx are not related to his lungs.    Dr. Aundria Rud, please advise. Thanks

## 2023-01-04 NOTE — Telephone Encounter (Signed)
Yes, please. While I think the double contrast esophagogram is consistent with reflux causing the cough, we will need the PFT's to rule out asthma.  Thanks [employer]

## 2023-01-07 ENCOUNTER — Other Ambulatory Visit: Payer: Self-pay | Admitting: Allergy

## 2023-01-07 NOTE — Telephone Encounter (Signed)
Patient is aware of below message/recommendations. He will proceed with PFT.  Nothing further needed.

## 2023-01-09 ENCOUNTER — Ambulatory Visit (HOSPITAL_COMMUNITY)
Admission: RE | Admit: 2023-01-09 | Discharge: 2023-01-09 | Disposition: A | Discharge status: Home / Self Care | Payer: 59 | Source: Ambulatory Visit | Attending: Student in an Organized Health Care Education/Training Program | Admitting: Student in an Organized Health Care Education/Training Program

## 2023-01-09 DIAGNOSIS — R053 Chronic cough: Secondary | ICD-10-CM | POA: Diagnosis not present

## 2023-01-09 LAB — PULMONARY FUNCTION TEST
FEF 25-75 Post: 6.34 L/s
FEF 25-75 Pre: 6.34 L/s
FEF2575-%Change-Post: 0 %
FEF2575-%Pred-Post: 166 %
FEF2575-%Pred-Pre: 167 %
FEV1-%Change-Post: 0 %
FEV1-%Pred-Post: 113 %
FEV1-%Pred-Pre: 112 %
FEV1-Post: 4.41 L
FEV1-Pre: 4.38 L
FEV1FVC-%Change-Post: 2 %
FEV1FVC-%Pred-Pre: 113 %
FEV6-%Change-Post: -1 %
FEV6-%Pred-Post: 99 %
FEV6-%Pred-Pre: 101 %
FEV6-Post: 4.73 L
FEV6-Pre: 4.82 L
FEV6FVC-%Pred-Post: 102 %
FEV6FVC-%Pred-Pre: 102 %
FVC-%Change-Post: -1 %
FVC-%Pred-Post: 97 %
FVC-%Pred-Pre: 99 %
FVC-Post: 4.73 L
FVC-Pre: 4.82 L
Post FEV1/FVC ratio: 93 %
Post FEV6/FVC ratio: 100 %
Pre FEV1/FVC ratio: 91 %
Pre FEV6/FVC Ratio: 100 %

## 2023-01-09 MED ORDER — ALBUTEROL SULFATE (2.5 MG/3ML) 0.083% IN NEBU
2.5000 mg | INHALATION_SOLUTION | Freq: Once | RESPIRATORY_TRACT | Status: AC
Start: 2023-01-09 — End: 2023-01-09
  Administered 2023-01-09: 2.5 mg via RESPIRATORY_TRACT

## 2023-01-18 ENCOUNTER — Ambulatory Visit: Payer: 59 | Admitting: Allergy

## 2023-02-05 ENCOUNTER — Ambulatory Visit: Payer: Medicare Other | Admitting: Student in an Organized Health Care Education/Training Program

## 2023-02-05 DIAGNOSIS — R748 Abnormal levels of other serum enzymes: Secondary | ICD-10-CM | POA: Diagnosis not present

## 2023-02-05 DIAGNOSIS — R945 Abnormal results of liver function studies: Secondary | ICD-10-CM | POA: Diagnosis not present

## 2023-02-05 DIAGNOSIS — Z0000 Encounter for general adult medical examination without abnormal findings: Secondary | ICD-10-CM | POA: Diagnosis not present

## 2023-02-05 DIAGNOSIS — K7689 Other specified diseases of liver: Secondary | ICD-10-CM | POA: Diagnosis not present

## 2023-02-05 DIAGNOSIS — R059 Cough, unspecified: Secondary | ICD-10-CM | POA: Diagnosis not present

## 2023-02-05 DIAGNOSIS — R932 Abnormal findings on diagnostic imaging of liver and biliary tract: Secondary | ICD-10-CM | POA: Diagnosis not present

## 2023-02-12 ENCOUNTER — Ambulatory Visit: Payer: Medicare Other | Admitting: Student in an Organized Health Care Education/Training Program

## 2023-02-19 ENCOUNTER — Ambulatory Visit: Payer: Medicare Other | Admitting: Podiatry

## 2023-02-19 VITALS — Ht 67.0 in | Wt 178.0 lb

## 2023-02-19 DIAGNOSIS — L602 Onychogryphosis: Secondary | ICD-10-CM | POA: Diagnosis not present

## 2023-02-19 DIAGNOSIS — L600 Ingrowing nail: Secondary | ICD-10-CM | POA: Diagnosis not present

## 2023-02-19 NOTE — Patient Instructions (Signed)
The type of nail trimmer I used is called a nail nipper, can be bought on [employer]

## 2023-02-19 NOTE — Progress Notes (Signed)
  Subjective:  Patient ID: Elijah Gordon, male    DOB: 1985-11-24,  MRN: [id number]  Chief Complaint  Patient presents with   nail trim    Trimmed at home but caused some pain and bleeding. No active bleeding at this time. Still some pain. This happened two days ago. Not diabetic.     37 y.o. male presents with the above complaint. History confirmed with patient.  He is having some pain in the left great toenail where he tried to cut it back  Objective:  Physical Exam: warm, good capillary refill, no trophic changes or ulcerative lesions, normal DP and PT pulses, normal sensory exam, and elongated nondystrophic toenails.  No paronychia of left great toe some tenderness medial border  Assessment:   1. Ingrowing left great toenail   2. Long toenail      Plan:  Patient was evaluated and treated and all questions answered.  I debrided his nails in length as a courtesy today with a sharp nail nipper.  We reviewed appropriate nail care on how to cut the nail straight across not to take into the corners to cause any ingrowing.  We discussed regular pedicures to maintain his nails as well he has tried this before and did not enjoy it.  We discussed that we could do this here for him on a regular basis but would not be covered by any Medicare plans as he does not have any qualifying risk factors or significant pain due to onychomycosis.  He will return to see me as needed.  Return if symptoms worsen or fail to improve.

## 2023-02-27 ENCOUNTER — Other Ambulatory Visit (HOSPITAL_BASED_OUTPATIENT_CLINIC_OR_DEPARTMENT_OTHER): Payer: Self-pay

## 2023-02-27 MED ORDER — INFLUENZA VIRUS VACC SPLIT PF (FLUZONE) 0.5 ML IM SUSY
0.5000 mL | PREFILLED_SYRINGE | Freq: Once | INTRAMUSCULAR | 0 refills | Status: AC
  Filled 2023-02-27: qty 0.5, 1d supply, fill #0

## 2023-02-28 ENCOUNTER — Other Ambulatory Visit (HOSPITAL_BASED_OUTPATIENT_CLINIC_OR_DEPARTMENT_OTHER): Payer: Self-pay

## 2023-03-04 ENCOUNTER — Encounter: Payer: Self-pay | Admitting: Student in an Organized Health Care Education/Training Program

## 2023-03-05 ENCOUNTER — Other Ambulatory Visit (HOSPITAL_BASED_OUTPATIENT_CLINIC_OR_DEPARTMENT_OTHER): Payer: Self-pay

## 2023-03-07 ENCOUNTER — Other Ambulatory Visit: Payer: Self-pay | Admitting: Gastroenterology

## 2023-03-07 DIAGNOSIS — K769 Liver disease, unspecified: Secondary | ICD-10-CM

## 2023-03-07 DIAGNOSIS — R932 Abnormal findings on diagnostic imaging of liver and biliary tract: Secondary | ICD-10-CM

## 2023-03-28 ENCOUNTER — Ambulatory Visit
Admission: RE | Admit: 2023-03-28 | Discharge: 2023-03-28 | Disposition: A | Discharge status: Home / Self Care | Payer: Medicare Other | Source: Ambulatory Visit | Attending: Gastroenterology | Admitting: Gastroenterology

## 2023-03-28 DIAGNOSIS — R161 Splenomegaly, not elsewhere classified: Secondary | ICD-10-CM | POA: Diagnosis not present

## 2023-03-28 DIAGNOSIS — K7689 Other specified diseases of liver: Secondary | ICD-10-CM | POA: Diagnosis not present

## 2023-03-28 DIAGNOSIS — K769 Liver disease, unspecified: Secondary | ICD-10-CM

## 2023-03-28 DIAGNOSIS — R932 Abnormal findings on diagnostic imaging of liver and biliary tract: Secondary | ICD-10-CM

## 2023-03-28 MED ORDER — GADOXETATE DISODIUM 0.25 MMOL/ML IV SOLN
8.0000 mL | Freq: Once | INTRAVENOUS | Status: AC | PRN
  Administered 2023-03-28: 8 mL via INTRAVENOUS

## 2023-05-13 DIAGNOSIS — H04123 Dry eye syndrome of bilateral lacrimal glands: Secondary | ICD-10-CM | POA: Diagnosis not present

## 2023-07-03 ENCOUNTER — Other Ambulatory Visit: Payer: Self-pay | Admitting: Allergy

## 2023-08-08 DIAGNOSIS — E781 Pure hyperglyceridemia: Secondary | ICD-10-CM | POA: Diagnosis not present

## 2023-08-08 DIAGNOSIS — R748 Abnormal levels of other serum enzymes: Secondary | ICD-10-CM | POA: Diagnosis not present

## 2023-08-08 LAB — LAB REPORT - SCANNED
A1c: 5.4
EGFR: 112

## 2023-08-13 DIAGNOSIS — E781 Pure hyperglyceridemia: Secondary | ICD-10-CM | POA: Diagnosis not present

## 2023-08-13 DIAGNOSIS — K219 Gastro-esophageal reflux disease without esophagitis: Secondary | ICD-10-CM | POA: Diagnosis not present

## 2023-08-13 DIAGNOSIS — R059 Cough, unspecified: Secondary | ICD-10-CM | POA: Diagnosis not present

## 2023-08-13 DIAGNOSIS — R748 Abnormal levels of other serum enzymes: Secondary | ICD-10-CM | POA: Diagnosis not present

## 2023-12-20 ENCOUNTER — Telehealth: Admitting: Family Medicine

## 2023-12-20 DIAGNOSIS — T63484A Toxic effect of venom of other arthropod, undetermined, initial encounter: Secondary | ICD-10-CM | POA: Diagnosis not present

## 2023-12-21 NOTE — Progress Notes (Signed)
 E-Visit for Insect Sting  Thank you for describing the insect sting for us .  Here is how we plan to help!  An uncomplicated insect sting that just occurred and can be closely followed using the instructions in your care plan.  The 2 greatest risks from insect stings are allergic reaction, which can be fatal in some people and infection, which is more common and less serious.  Bees, wasps, yellow jackets, and hornets belong to a class of insects called Hymenoptera.  Most insect stings cause only minor discomfort.  Stings can happen anywhere on the body and can be painful.  Most stings are from honey bees or yellow jackets.  Fire ants can sting multiple times.  The sites of the stings are more likely to become infected.    Provided a home care guide for insect stings and instructions on when to call for help.  What can be used to prevent Insect Stings?  Insect repellant with at least 20% DEET.  Wearing long pants and shirts with socks and shoes.  Wear dark or drab-colored clothes rather than bright colors.  Avoid using perfumes and hair sprays; these attract insects.  HOME CARE ADVICE:  1. Stinger removal: The stinger looks like a tiny black dot in the sting. Use a fingernail, credit card edge, or knife-edge to scrape it off.  Don't pull it out because it squeezes out more venom. If the stinger is below the skin surface, leave it alone.  It will be shed with normal skin healing. 2. Use cold compresses to the area of the sting for 10-20 minutes.  You may repeat this as needed to relieve symptoms of pain and swelling. 3.  For pain relief, take acetominophen 650 mg 4-6 hours as needed or ibuprofen 400 mg every 6-8 hours as needed or naproxen 250-500 mg every 12 hours as needed. 4.  You can also use hydrocortisone cream 0.5% or 1% up to 4 times daily as needed for itching. 5.  If the sting becomes very itchy, take Benadryl 25-50 mg, follow directions on box. 6.  Wash the area 2-3 times  daily with antibacterial soap and warm water. 7. Call your Doctor if: Fever, a severe headache, or rash occur in the next 2 weeks. Sting area begins to look infected. Redness and swelling worsens after home treatment. Your current symptoms become worse.    MAKE SURE YOU:  Understand these instructions. Will watch your condition. Will get help right away if you are not doing well or get worse.  Thank you for choosing an e-visit.  Your e-visit answers were reviewed by a board certified advanced clinical practitioner to complete your personal care plan. Depending upon the condition, your plan could have included both over the counter or prescription medications.  Please review your pharmacy choice. Make sure the pharmacy is open so you can pick up prescription now. If there is a problem, you may contact your provider through [employer] and have the prescription routed to another pharmacy.  Your safety is important to us . If you have drug allergies check your prescription carefully.   For the next 24 hours you can use MyChart to ask questions about today's visit, request a non-urgent call back, or ask for a work or school excuse. You will get an email in the next two days asking about your experience. I hope that your e-visit has been valuable and will speed your recovery.    have provided 5 minutes of non face to face  time during this encounter for chart review and documentation.

## 2024-01-01 ENCOUNTER — Ambulatory Visit (HOSPITAL_BASED_OUTPATIENT_CLINIC_OR_DEPARTMENT_OTHER): Admitting: Family Medicine

## 2024-01-07 ENCOUNTER — Encounter (HOSPITAL_BASED_OUTPATIENT_CLINIC_OR_DEPARTMENT_OTHER): Payer: Self-pay | Admitting: Family Medicine

## 2024-01-20 ENCOUNTER — Other Ambulatory Visit (HOSPITAL_BASED_OUTPATIENT_CLINIC_OR_DEPARTMENT_OTHER): Payer: Self-pay

## 2024-01-20 ENCOUNTER — Ambulatory Visit (INDEPENDENT_AMBULATORY_CARE_PROVIDER_SITE_OTHER): Admitting: Family Medicine

## 2024-01-20 ENCOUNTER — Encounter (HOSPITAL_BASED_OUTPATIENT_CLINIC_OR_DEPARTMENT_OTHER): Payer: Self-pay | Admitting: Family Medicine

## 2024-01-20 VITALS — BP 117/83 | HR 67 | Temp 97.9°F | Ht 67.0 in | Wt 154.0 lb

## 2024-01-20 DIAGNOSIS — Z91018 Allergy to other foods: Secondary | ICD-10-CM | POA: Insufficient documentation

## 2024-01-20 DIAGNOSIS — F411 Generalized anxiety disorder: Secondary | ICD-10-CM | POA: Diagnosis not present

## 2024-01-20 DIAGNOSIS — Z7689 Persons encountering health services in other specified circumstances: Secondary | ICD-10-CM | POA: Diagnosis not present

## 2024-01-20 DIAGNOSIS — J018 Other acute sinusitis: Secondary | ICD-10-CM

## 2024-01-20 MED ORDER — AZITHROMYCIN 250 MG PO TABS
ORAL_TABLET | ORAL | 0 refills | Status: AC
  Filled 2024-01-20: qty 6, 5d supply, fill #0

## 2024-01-20 MED ORDER — HYDROXYZINE HCL 25 MG PO TABS
25.0000 mg | ORAL_TABLET | Freq: Every day | ORAL | 3 refills | Status: AC
  Filled 2024-01-20: qty 30, 30d supply, fill #0

## 2024-01-20 NOTE — Patient Instructions (Signed)
  Rest, drink plenty of fluids.  Tylenol for fever, body aches.   For nasal congestion: -Use over-the-counter Flonase: 2 nasal sprays on each side of the nose in the morning until you feel better  Avoid decongestants such as  Pseudoephedrine or phenylephrine   Take the antibiotic as prescribed    Call if not gradually better over the next  10 days  Call anytime if the symptoms are severe, you have high fever, short of breath, chest pain

## 2024-01-20 NOTE — Progress Notes (Signed)
 New Patient Office Visit  Subjective:   Elijah Gordon 31-Aug-1985 01/20/2024  Chief Complaint  Patient presents with   New Patient (Initial Visit)    Patient is here today to get established with the practice. Denies any main concerns for today's visit.    Discussed the use of AI scribe software for clinical note transcription with the patient, who gave verbal consent to proceed.  History of Present Illness  Elijah Gordon presents today to establish care at Primary Care and Sports Medicine at [facility]. Introduced to Publishing rights manager role and practice setting.  All questions answered.   Last PCP: Cheryle Frees, MD [facility]  Last annual physical: Over 1 year ago  Concerns: See below   Onur Wank is a 38 year old male with ADHD, anxiety with depression, and autism who presents for a review of his medical history and medication management.  ANXIETY:  He has a history of ADHD, anxiety with depression, and autism. He discontinued all psychiatric medications about five months ago, stating they were not beneficial and he feels better without them. He acknowledges ongoing ADHD symptoms but prefers not to see a psychiatrist to avoid being 'loaded up again with medications'. He is interested in an as-needed medication for anxiety, particularly during high anxiety episodes.  SINUSITIS:  He currently takes omeprazole 40 mg every morning and uses DayQuil for cold symptoms. He has a history of allergies and is currently experiencing symptoms such as nasal congestion and pressure, which have persisted for at least one to two weeks. He tested negative for COVID-19 and attributes his symptoms to allergies. He also uses Claritin and has an albuterol  inhaler as needed.  ALLERGIES:  He recalls a severe allergic reaction to nuts in his teenage years, resulting in an emergency room visit. He avoids nuts but is interested in allergy testing to confirm this allergy.  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   The following portions of the patient's history were reviewed and updated as appropriate: past medical history, past surgical history, family history, social history, allergies, medications, and problem list.   Patient Active Problem List   Diagnosis Date Noted   Generalized anxiety disorder 01/20/2024   Multiple food allergies 01/20/2024   Anxiety with depression 08/16/2021   ADHD (attention deficit hyperactivity disorder), predominantly hyperactive impulsive type 09/18/2001   Autistic disorder 09/18/2001   Past Medical History:  Diagnosis Date   Autism    Constipation 10/29/2001   Occasional. Will use an enema PRN     History reviewed. No pertinent surgical history. Family History  Problem Relation Age of Onset   Eczema Mother    Allergic rhinitis Mother    ADD / ADHD Mother    Diabetes Mother    Diabetes Father    Allergic rhinitis Sister    Social History   Socioeconomic History   Marital status: Single    Spouse name: Not on file   Number of children: Not on file   Years of education: Not on file   Highest education level: Not on file  Occupational History   Occupation: [employer]  Tobacco Use   Smoking status: Never    Passive exposure: Never   Smokeless tobacco: Never  Vaping Use   Vaping status: Never Used  Substance and Sexual Activity   Alcohol use: Not Currently   Drug use: Never   Sexual activity: Never  Other Topics Concern  Not on file  Social History Narrative   Lives at home with mother and step father in [place].    Social Drivers of Corporate investment banker Strain: Not on file  Food Insecurity: Not on file  Transportation Needs: Not on file  Physical Activity: Not on file  Stress: Not on file  Social Connections: Unknown (04/26/2021)   Received from [facility]   Social Connection and Isolation Panel    Frequency of Communication with  Friends and Family: Not on file    Frequency of Social Gatherings with Friends and Family: Not on file    Attends Religious Services: Not on file    Active Member of Clubs or Organizations: Not on file    Attends Banker Meetings: Not on file    Are you married, widowed, divorced, separated, never married, or living with a partner?: Never married  Intimate Partner Violence: Not on file   Outpatient Medications Prior to Visit  Medication Sig Dispense Refill   loratadine (CLARITIN) 10 MG tablet Take 10 mg by mouth daily.     omeprazole (PRILOSEC) 40 MG capsule Take 40 mg by mouth daily.     albuterol  (VENTOLIN  HFA) 108 (90 Base) MCG/ACT inhaler Inhale 1 puff into the lungs every 4 (four) hours as needed for wheezing or shortness of breath.     cetirizine (ZYRTEC) 10 MG chewable tablet Chew 10 mg by mouth daily.     cyanocobalamin (VITAMIN B12) 1000 MCG tablet Take 2,000-2,500 mcg by mouth daily.     famotidine  (PEPCID ) 20 MG tablet TAKE 1 TABLET BY MOUTH EVERY DAY 90 tablet 1   Multiple Vitamins-Minerals (MENS MULTIVITAMIN PLUS PO) Take 1 tablet by mouth daily.     ondansetron  (ZOFRAN -ODT) 4 MG disintegrating tablet Take 1 tablet (4 mg total) by mouth every 8 (eight) hours as needed for nausea or vomiting. 20 tablet 0   No facility-administered medications prior to visit.   Allergies  Allergen Reactions   Sulfa Antibiotics Anaphylaxis    ROS: A complete ROS was performed with pertinent positives/negatives noted in the HPI. The remainder of the ROS are negative.   Objective:   Today's Vitals   01/20/24 1401  BP: 117/83  Pulse: 67  Temp: 97.9 F (36.6 C)  TempSrc: Oral  SpO2: 99%  Weight: 154 lb (69.9 kg)  Height: 5' 7 (1.702 m)    GENERAL: Well-appearing, in NAD. Well nourished.  SKIN: Pink, warm and dry.  Head: Normocephalic.  NECK: Trachea midline. Full ROM w/o pain or tenderness. No lymphadenopathy.  EARS: Tympanic membranes are intact, translucent without  bulging and without drainage. Appropriate landmarks visualized.  EYES: Conjunctiva clear without exudates. EOMI, PERRL, no drainage present.  NOSE: Septum midline w/o deformity. Nares patent, mucosa pink and mildly inflamed w/ cloudy drainage. No sinus tenderness.  THROAT: Uvula midline. Oropharynx clear.  Mucous membranes pink and moist.  RESPIRATORY: Chest wall symmetrical. Respirations even and non-labored. Breath sounds clear to auscultation bilaterally.  CARDIAC: S1, S2 present, regular rate and rhythm without murmur or gallops. Peripheral pulses 2+ bilaterally.  MSK: Muscle tone and strength appropriate for age.  NEUROLOGIC: No motor or sensory deficits. Steady, even gait. C2-C12 intact.  PSYCH/MENTAL STATUS: Alert, oriented x 3. Cooperative, appropriate mood and affect.       Assessment & Plan:  1. Encounter to establish care with new doctor (Primary) Reviewed patient's PMH, medications, family and surgical history. Discussed role of PCP with patient.   2.  Acute non-recurrent sinusitis of other sinus Given duration of symptoms and no improvement with OTC medications, will treat with Z-pack and continue OTC medications. Return if no improvement.   3. Multiple food allergies Referral placed to allergist for allergy testing given concern of previous reaction with nuts.  - Ambulatory referral to Allergy  4. Generalized anxiety disorder Currently controlled without daily medication per patient. Safety plan reviewed. Will provide refill of hydroxyzine  to use as needed. Safe use of medication reviewed with patient.    Patient to reach out to office if new, worrisome, or unresolved symptoms arise or if no improvement in patient's condition. Patient verbalized understanding and is agreeable to treatment plan. All questions answered to patient's satisfaction.    Return in about 6 weeks (around 03/02/2024) for ANNUAL PHYSICAL.    Thersia Schuyler Stark, [place]

## 2024-01-21 ENCOUNTER — Other Ambulatory Visit (HOSPITAL_BASED_OUTPATIENT_CLINIC_OR_DEPARTMENT_OTHER): Payer: Self-pay

## 2024-01-22 ENCOUNTER — Encounter (HOSPITAL_BASED_OUTPATIENT_CLINIC_OR_DEPARTMENT_OTHER): Payer: Self-pay | Admitting: *Deleted

## 2024-01-28 ENCOUNTER — Other Ambulatory Visit (HOSPITAL_BASED_OUTPATIENT_CLINIC_OR_DEPARTMENT_OTHER): Payer: Self-pay

## 2024-01-28 MED ORDER — FLUZONE 0.5 ML IM SUSY
0.5000 mL | PREFILLED_SYRINGE | Freq: Once | INTRAMUSCULAR | 0 refills | Status: AC
  Filled 2024-01-28: qty 0.5, 1d supply, fill #0

## 2024-01-30 ENCOUNTER — Encounter (HOSPITAL_BASED_OUTPATIENT_CLINIC_OR_DEPARTMENT_OTHER): Payer: Self-pay | Admitting: Family Medicine

## 2024-01-30 DIAGNOSIS — K2270 Barrett's esophagus without dysplasia: Secondary | ICD-10-CM | POA: Insufficient documentation

## 2024-02-07 ENCOUNTER — Other Ambulatory Visit (HOSPITAL_BASED_OUTPATIENT_CLINIC_OR_DEPARTMENT_OTHER): Payer: Self-pay

## 2024-02-07 NOTE — Telephone Encounter (Signed)
 Copied from CRM #[id number]. Topic: Referral - Request for Referral >> Feb 07, 2024  8:32 AM Myrick T wrote: Patient request a call from Dr Knute before she places the referral  Did the patient discuss referral with their provider in the last year? Yes  Appointment offered? No  Type of order/referral and detailed reason for visit: Allergist  Preference of office, provider, location: any location other than [facility]  If referral order, have you been seen by this specialty before? No (If Yes, this issue or another issue? When? Where?  Can we respond through MyChart? Yes

## 2024-02-07 NOTE — Telephone Encounter (Signed)
 Alexis, please see message and advise of any other allergy offices pt might be able to be referred to.

## 2024-02-10 ENCOUNTER — Ambulatory Visit: Admitting: Allergy

## 2024-02-19 ENCOUNTER — Telehealth (HOSPITAL_BASED_OUTPATIENT_CLINIC_OR_DEPARTMENT_OTHER): Payer: Self-pay | Admitting: Family Medicine

## 2024-02-19 NOTE — Telephone Encounter (Signed)
 Copied from CRM [id number]. Topic: General - Other >> Feb 19, 2024 11:14 AM Donee H wrote: Reason for CRM: Patient calling requesting to know is pcp  Thersia Stark will sign forms for him stating he is disabled. He is trying to apply for disablity. He currently does not have the forms but would like to know what steps to take. He is requesting for either pcp or her nurse to give him a call to speak more about this. Callback number [phone]

## 2024-02-19 NOTE — Telephone Encounter (Signed)
 Elijah Gordon, please advise on message from [facility] about pt in regards to what you recommend.

## 2024-02-24 NOTE — Telephone Encounter (Signed)
Mychart sent to pt  Will await response

## 2024-02-27 NOTE — Telephone Encounter (Signed)
 Pt has upcoming physical 11/10. This can be discussed during that appt.

## 2024-03-02 ENCOUNTER — Ambulatory Visit (HOSPITAL_BASED_OUTPATIENT_CLINIC_OR_DEPARTMENT_OTHER): Admitting: Family Medicine

## 2024-03-03 ENCOUNTER — Ambulatory Visit (HOSPITAL_BASED_OUTPATIENT_CLINIC_OR_DEPARTMENT_OTHER): Admitting: Family Medicine

## 2024-03-03 NOTE — Telephone Encounter (Signed)
 Pt had to cancel appt due to things that were happening at work. Pt sent new mychart message. Please advise.

## 2024-03-09 ENCOUNTER — Ambulatory Visit (INDEPENDENT_AMBULATORY_CARE_PROVIDER_SITE_OTHER): Admitting: Family Medicine

## 2024-03-09 ENCOUNTER — Other Ambulatory Visit (HOSPITAL_BASED_OUTPATIENT_CLINIC_OR_DEPARTMENT_OTHER): Payer: Self-pay

## 2024-03-09 ENCOUNTER — Encounter (HOSPITAL_BASED_OUTPATIENT_CLINIC_OR_DEPARTMENT_OTHER): Payer: Self-pay | Admitting: Family Medicine

## 2024-03-09 VITALS — BP 133/88 | HR 72 | Ht 67.0 in | Wt 158.0 lb

## 2024-03-09 DIAGNOSIS — Z91018 Allergy to other foods: Secondary | ICD-10-CM

## 2024-03-09 DIAGNOSIS — L2489 Irritant contact dermatitis due to other agents: Secondary | ICD-10-CM | POA: Diagnosis not present

## 2024-03-09 MED ORDER — EPINEPHRINE 0.3 MG/0.3ML IJ SOAJ
0.3000 mg | INTRAMUSCULAR | 2 refills | Status: AC | PRN
  Filled 2024-03-09: qty 2, 30d supply, fill #0
  Filled 2024-04-22: qty 2, 30d supply, fill #1

## 2024-03-09 NOTE — Patient Instructions (Signed)
 RASH:  Apply hydrocortisone cream twice daily.  Use Vaseline twice daily.  Consider other type of apple watch band and do not use when washing dishes.

## 2024-03-09 NOTE — Progress Notes (Signed)
 Acute Care Office Visit  Subjective:   Elijah Gordon April 01, 1986 03/09/2024  Chief Complaint  Patient presents with   Rash    Pt is here to have a rash on arm looked at. States 3 days ago he felt like he might have had an allergic reaction as he got where he could not breathe and then started breaking out in a rash. Left arm at wrist is broken out and states it is very itchy and getting worse.    HPI:  Patient senting with rash to left arm at the wrist area ongoing for the past 3 days.  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  He has not has not tried any topical treatments for it.  Reports itching to the area.  Denies shortness of breath, tongue swelling, hives.     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  He is currently unable to afford allergy testing.  Requesting EpiPen to have on hand given concern of recurrent anaphylactic reaction.  Reports he did have food over the weekend in which he started having hives, itching, and allergic reaction like symptoms.   The following portions of the patient's history were reviewed and updated as appropriate: past medical history, past surgical history, family history, social history, allergies, medications, and problem list.   Patient Active Problem List   Diagnosis Date Noted   Barrett's esophagus 01/30/2024   Generalized anxiety disorder 01/20/2024   Multiple food allergies 01/20/2024   Anxiety with depression 08/16/2021   ADHD (attention deficit hyperactivity disorder), predominantly hyperactive impulsive type 09/18/2001   Autistic disorder 09/18/2001   Past Medical History:  Diagnosis Date   Autism    Constipation 10/29/2001   Occasional. Will use an enema PRN     History reviewed. No pertinent surgical history. Family History  Problem Relation Age of Onset   Eczema Mother    Allergic  rhinitis Mother    ADD / ADHD Mother    Diabetes Mother    Diabetes Father    Allergic rhinitis Sister    Outpatient Medications Prior to Visit  Medication Sig Dispense Refill   albuterol  (VENTOLIN  HFA) 108 (90 Base) MCG/ACT inhaler Inhale 1 puff into the lungs every 4 (four) hours as needed for wheezing or shortness of breath.     hydrOXYzine  (ATARAX ) 25 MG tablet Take 1 tablet (25 mg total) by mouth at bedtime. 30 tablet 3   loratadine (CLARITIN) 10 MG tablet Take 10 mg by mouth daily.     omeprazole (PRILOSEC) 40 MG capsule Take 40 mg by mouth daily.     No facility-administered medications prior to visit.   Allergies  Allergen Reactions   Sulfa Antibiotics Anaphylaxis     ROS: A complete ROS was performed with pertinent positives/negatives noted in the HPI. The remainder of the ROS are negative.    Objective:   Today's Vitals   03/09/24 1452  BP: 133/88  Pulse: 72  SpO2: 97%  Weight: 158 lb (71.7 kg)  Height: 5' 7 (1.702 m)    GENERAL: Well-appearing, in NAD. Well nourished.  SKIN: Pink, warm and dry.  Small red, macular rash circumferential to left wrist without swelling, drainage or ulceration.  Head: Normocephalic. NECK: Trachea midline. Full ROM w/o pain or tenderness.  RESPIRATORY: Chest wall symmetrical. Respirations even and non-labored.  MSK: Muscle tone and  strength appropriate for age. NEUROLOGIC: No motor or sensory deficits. Steady, even gait. C2-C12 intact.  PSYCH/MENTAL STATUS: Alert, oriented x 3. Cooperative, appropriate mood and affect.    No results found for any visits on 03/09/24.    Assessment & Plan:  1. Irritant contact dermatitis due to other agents (Primary) Irritation rash present to left wrist. Pt to use hydrocortisone cream, vaseline twice daily and avoid placement of watch band until healed. May use hydroxyzine  if needed for itching.   2. Multiple food allergies Epi Pen sent in for use of anaphylaxis only.. Education on use of epi  pen provided to patient and PCP recommends continuing with allergy testing if possible. If Epi Pen used, will go to ER.    Meds ordered this encounter  Medications   EPINEPHrine 0.3 mg/0.3 mL IJ SOAJ injection    Sig: Inject 0.3 mg into the muscle as needed for anaphylaxis.    Dispense:  2 each    Refill:  2    Supervising Provider:   DE CUBA, RAYMOND J [id number]   Lab Orders  No laboratory test(s) ordered today   No images are attached to the encounter or orders placed in the encounter.  Return if symptoms worsen or fail to improve.    Patient to reach out to office if new, worrisome, or unresolved symptoms arise or if no improvement in patient's condition. Patient verbalized understanding and is agreeable to treatment plan. All questions answered to patient's satisfaction.    Elijah Gordon, [place]

## 2024-03-27 ENCOUNTER — Ambulatory Visit (INDEPENDENT_AMBULATORY_CARE_PROVIDER_SITE_OTHER): Admitting: Family Medicine

## 2024-03-27 ENCOUNTER — Encounter (HOSPITAL_BASED_OUTPATIENT_CLINIC_OR_DEPARTMENT_OTHER): Payer: Self-pay | Admitting: Family Medicine

## 2024-03-27 VITALS — BP 110/84 | HR 64 | Resp 20 | Ht 67.0 in | Wt 157.8 lb

## 2024-03-27 DIAGNOSIS — F901 Attention-deficit hyperactivity disorder, predominantly hyperactive type: Secondary | ICD-10-CM

## 2024-03-27 DIAGNOSIS — F840 Autistic disorder: Secondary | ICD-10-CM

## 2024-03-27 NOTE — Patient Instructions (Addendum)
 [facility]     [email or web address] [address] [place], [place] [address]

## 2024-03-27 NOTE — Progress Notes (Signed)
 Subjective:   Elijah Gordon [birth year] 03/27/2024  Chief Complaint  Patient presents with   ADHD    Discussed the use of AI scribe software for clinical note transcription with the patient, who gave verbal consent to proceed.  History of Present Illness Elijah Gordon is a 38 year old male with ADHD who presents for medication management.  He feels 'very hyper' and is seeking medication management for ADHD. He was diagnosed with ADHD as a child and has been on various psychiatric medications in the past, including Intuniv  (guanfacine ) and stimulants like Adderall. He stopped all psychiatric medications abruptly due to perceived ineffectiveness and experienced a rough withdrawal period for the first three weeks but feels better now except for the ADHD symptoms. He recalls possible adverse side effects with stimulants in the past. He has difficulty with focusing, staying still and able to complete work.   He has been heavily dosed with psychiatric medications in the past and is concerned about being prescribed multiple medications again. He is apprehensive about seeing a psychiatrist due to past experiences and fears of being prescribed unnecessary medications. He recalls being recommended to see a psychiatrist for evaluation but has not pursued it due to cost and previous negative experiences.  He has not been on any ADHD medication recently and is not currently seeing any mental health professionals. He is interested in starting ADHD medication again and is exploring options for psychiatric care that focus on ADHD management.  No additional symptoms reported during the review of systems, aside from those related to ADHD.     The following portions of the patient's history were reviewed and updated as appropriate: past medical history, past surgical history, family history, social history, allergies, medications, and problem list.   Patient Active Problem List   Diagnosis Date Noted    Barrett's esophagus 01/30/2024   Generalized anxiety disorder 01/20/2024   Multiple food allergies 01/20/2024   Anxiety with depression 08/16/2021   ADHD (attention deficit hyperactivity disorder), predominantly hyperactive impulsive type 09/18/2001   Autistic disorder 09/18/2001   Past Medical History:  Diagnosis Date   Autism    Constipation 10/29/2001   Occasional. Will use an enema PRN     History reviewed. No pertinent surgical history. Family History  Problem Relation Age of Onset   Eczema Mother    Allergic rhinitis Mother    ADD / ADHD Mother    Diabetes Mother    Diabetes Father    Allergic rhinitis Sister    Outpatient Medications Prior to Visit  Medication Sig Dispense Refill   albuterol  (VENTOLIN  HFA) 108 (90 Base) MCG/ACT inhaler Inhale 1 puff into the lungs every 4 (four) hours as needed for wheezing or shortness of breath.     EPINEPHrine  0.3 mg/0.3 mL IJ SOAJ injection Inject 0.3 mg into the muscle as needed for anaphylaxis. 2 each 2   hydrOXYzine  (ATARAX ) 25 MG tablet Take 1 tablet (25 mg total) by mouth at bedtime. 30 tablet 3   loratadine (CLARITIN) 10 MG tablet Take 10 mg by mouth daily.     omeprazole (PRILOSEC) 40 MG capsule Take 40 mg by mouth daily.     No facility-administered medications prior to visit.   Allergies  Allergen Reactions   Sulfa Antibiotics Anaphylaxis     ROS: A complete ROS was performed with pertinent positives/negatives noted in the HPI. The remainder of the ROS are negative.    Objective:   Today's Vitals   03/27/24 0920  BP: 110/84  Pulse: 64  Resp: 20  SpO2: 98%  Weight: 157 lb 12.8 oz (71.6 kg)  Height: 5' 7 (1.702 m)  PainSc: 0-No pain    Physical Exam   GENERAL: Well-appearing, in NAD. Well nourished.  SKIN: Pink, warm and dry. No rash, lesion, ulceration, or ecchymoses.  Head: Normocephalic. NECK: Trachea midline. Full ROM w/o pain or tenderness.  RESPIRATORY: Chest wall symmetrical. Respirations even and  non-labored.  EXTREMITIES: Without clubbing, cyanosis, or edema.  NEUROLOGIC: No motor or sensory deficits. Steady, even gait. C2-C12 intact.  PSYCH/MENTAL STATUS: Alert, oriented x 3. Cooperative, appropriate mood and affect. Slightly distracted, pressured and rapid speech with hyperactivity and fidgeting.       Assessment & Plan:  1. ADHD (attention deficit hyperactivity disorder), predominantly hyperactive impulsive type (Primary) 2. Autistic disorder Discussed concerns of intolerance to stimulants in the past. Intuniv  may be a possibility and patient would like to discuss further with Psychiatry/Attention Specialist given trial of multiple medications in the past with ADHD and Autism spectrum disorder. Will place referral and information for [facility] provided.  - Ambulatory referral to Psychiatry   Return if symptoms worsen or fail to improve.    Patient to reach out to office if new, worrisome, or unresolved symptoms arise or if no improvement in patient's condition. Patient verbalized understanding and is agreeable to treatment plan. All questions answered to patient's satisfaction.    Thersia Schuyler Stark, [place]

## 2024-04-01 ENCOUNTER — Encounter (HOSPITAL_BASED_OUTPATIENT_CLINIC_OR_DEPARTMENT_OTHER): Payer: Self-pay | Admitting: Family Medicine

## 2024-04-01 NOTE — Telephone Encounter (Signed)
 Please see mychart message sent by pt and advise.

## 2024-04-28 ENCOUNTER — Other Ambulatory Visit (HOSPITAL_BASED_OUTPATIENT_CLINIC_OR_DEPARTMENT_OTHER): Payer: Self-pay

## 2024-04-30 ENCOUNTER — Telehealth (HOSPITAL_BASED_OUTPATIENT_CLINIC_OR_DEPARTMENT_OTHER): Payer: Self-pay | Admitting: Family Medicine

## 2024-04-30 ENCOUNTER — Ambulatory Visit (HOSPITAL_BASED_OUTPATIENT_CLINIC_OR_DEPARTMENT_OTHER): Admitting: Family Medicine

## 2024-04-30 NOTE — Telephone Encounter (Signed)
 Copied from CRM [id number]. Topic: Medicare AWV >> Apr 30, 2024 11:52 AM Nathanel DEL wrote: Called 04/30/2024 to sched AWV - NO VOICEMAIL  Nathanel Paschal; Care Guide Ambulatory Clinical Support [place] [facility] Direct Dial: [phone]

## 2024-05-01 ENCOUNTER — Encounter (HOSPITAL_BASED_OUTPATIENT_CLINIC_OR_DEPARTMENT_OTHER): Payer: Self-pay

## 2024-05-04 ENCOUNTER — Encounter (HOSPITAL_BASED_OUTPATIENT_CLINIC_OR_DEPARTMENT_OTHER): Payer: Self-pay

## 2024-05-04 ENCOUNTER — Ambulatory Visit (HOSPITAL_BASED_OUTPATIENT_CLINIC_OR_DEPARTMENT_OTHER)
